# Patient Record
Sex: Male | Born: 1970 | Race: White | Hispanic: No | Smoking: Never smoker
Health system: Southern US, Community
[De-identification: ages and names within clinical notes are randomized; demographics above are authoritative.]

---

## 2000-11-14 ENCOUNTER — Ambulatory Visit (HOSPITAL_COMMUNITY): Admission: RE | Admit: 2000-11-14 | Discharge: 2000-11-14 | Payer: Self-pay | Admitting: Internal Medicine

## 2000-11-14 ENCOUNTER — Encounter: Payer: Self-pay | Admitting: Internal Medicine

## 2002-04-29 HISTORY — PX: VASECTOMY: SHX75

## 2013-04-13 ENCOUNTER — Encounter (HOSPITAL_COMMUNITY): Payer: Self-pay | Admitting: Emergency Medicine

## 2013-04-13 ENCOUNTER — Encounter (HOSPITAL_COMMUNITY): Payer: PRIVATE HEALTH INSURANCE | Admitting: Anesthesiology

## 2013-04-13 ENCOUNTER — Emergency Department (HOSPITAL_COMMUNITY): Payer: PRIVATE HEALTH INSURANCE | Admitting: Anesthesiology

## 2013-04-13 ENCOUNTER — Emergency Department (HOSPITAL_COMMUNITY): Payer: PRIVATE HEALTH INSURANCE

## 2013-04-13 ENCOUNTER — Encounter (HOSPITAL_COMMUNITY)
Admission: EM | Disposition: A | Discharge status: Home / Self Care | Payer: Self-pay | Source: Home / Self Care | Attending: Emergency Medicine

## 2013-04-13 ENCOUNTER — Observation Stay (HOSPITAL_COMMUNITY)
Admission: EM | Admit: 2013-04-13 | Discharge: 2013-04-14 | Disposition: A | Discharge status: Home / Self Care | Payer: PRIVATE HEALTH INSURANCE | Attending: General Surgery | Admitting: General Surgery

## 2013-04-13 DIAGNOSIS — K3580 Unspecified acute appendicitis: Secondary | ICD-10-CM | POA: Diagnosis present

## 2013-04-13 DIAGNOSIS — I789 Disease of capillaries, unspecified: Secondary | ICD-10-CM | POA: Insufficient documentation

## 2013-04-13 DIAGNOSIS — K3533 Acute appendicitis with perforation and localized peritonitis, with abscess: Principal | ICD-10-CM | POA: Insufficient documentation

## 2013-04-13 DIAGNOSIS — Z01812 Encounter for preprocedural laboratory examination: Secondary | ICD-10-CM | POA: Insufficient documentation

## 2013-04-13 HISTORY — PX: LAPAROSCOPIC APPENDECTOMY: SHX408

## 2013-04-13 LAB — CBC WITH DIFFERENTIAL/PLATELET
Basophils Absolute: 0 10*3/uL (ref 0.0–0.1)
Basophils Relative: 0 % (ref 0–1)
Eosinophils Absolute: 0 10*3/uL (ref 0.0–0.7)
Eosinophils Relative: 0 % (ref 0–5)
HCT: 41.3 % (ref 39.0–52.0)
Hemoglobin: 14.3 g/dL (ref 13.0–17.0)
Lymphocytes Relative: 10 % — ABNORMAL LOW (ref 12–46)
Lymphs Abs: 1.5 10*3/uL (ref 0.7–4.0)
MCH: 31.1 pg (ref 26.0–34.0)
MCHC: 34.6 g/dL (ref 30.0–36.0)
MCV: 89.8 fL (ref 78.0–100.0)
Monocytes Absolute: 1 10*3/uL (ref 0.1–1.0)
Monocytes Relative: 7 % (ref 3–12)
Neutro Abs: 12.7 10*3/uL — ABNORMAL HIGH (ref 1.7–7.7)
Neutrophils Relative %: 84 % — ABNORMAL HIGH (ref 43–77)
Platelets: 190 10*3/uL (ref 150–400)
RBC: 4.6 MIL/uL (ref 4.22–5.81)
RDW: 13.8 % (ref 11.5–15.5)
WBC: 15.2 10*3/uL — ABNORMAL HIGH (ref 4.0–10.5)

## 2013-04-13 LAB — COMPREHENSIVE METABOLIC PANEL
ALT: 24 U/L (ref 0–53)
AST: 23 U/L (ref 0–37)
Albumin: 4.1 g/dL (ref 3.5–5.2)
Alkaline Phosphatase: 97 U/L (ref 39–117)
BUN: 11 mg/dL (ref 6–23)
CO2: 25 mEq/L (ref 19–32)
Calcium: 9.1 mg/dL (ref 8.4–10.5)
Chloride: 103 mEq/L (ref 96–112)
Creatinine, Ser: 0.85 mg/dL (ref 0.50–1.35)
GFR calc Af Amer: >90 mL/min (ref >90)
GFR calc non Af Amer: >90 mL/min (ref >90)
Glucose, Bld: 133 mg/dL — ABNORMAL HIGH (ref 70–99)
Potassium: 3.5 mEq/L (ref 3.5–5.1)
Sodium: 140 mEq/L (ref 135–145)
Total Bilirubin: 1.1 mg/dL (ref 0.3–1.2)
Total Protein: 7.8 g/dL (ref 6.0–8.3)

## 2013-04-13 LAB — LIPASE, BLOOD: Lipase: 19 U/L (ref 11–59)

## 2013-04-13 SURGERY — APPENDECTOMY, LAPAROSCOPIC
Anesthesia: General | Site: Abdomen

## 2013-04-13 MED ORDER — METOPROLOL TARTRATE 1 MG/ML IV SOLN
INTRAVENOUS | Status: AC
  Filled 2013-04-13: qty 5

## 2013-04-13 MED ORDER — ONDANSETRON HCL 4 MG/2ML IJ SOLN
INTRAMUSCULAR | Status: AC
  Filled 2013-04-13: qty 2

## 2013-04-13 MED ORDER — OXYCODONE-ACETAMINOPHEN 5-325 MG PO TABS
1.0000 | ORAL_TABLET | ORAL | Status: DC | PRN
  Administered 2013-04-13: 2 via ORAL
  Administered 2013-04-14: 1 via ORAL
  Administered 2013-04-14: 2 via ORAL
  Filled 2013-04-13 (×2): qty 2
  Filled 2013-04-13: qty 1

## 2013-04-13 MED ORDER — HYDROMORPHONE HCL PF 1 MG/ML IJ SOLN
1.0000 mg | INTRAMUSCULAR | Status: DC | PRN
  Administered 2013-04-13 – 2013-04-14 (×2): 1 mg via INTRAVENOUS
  Filled 2013-04-13 (×2): qty 1

## 2013-04-13 MED ORDER — BUPIVACAINE HCL (PF) 0.5 % IJ SOLN
INTRAMUSCULAR | Status: DC | PRN
  Administered 2013-04-13: 10 mL

## 2013-04-13 MED ORDER — ROCURONIUM BROMIDE 50 MG/5ML IV SOLN
INTRAVENOUS | Status: AC
  Filled 2013-04-13: qty 1

## 2013-04-13 MED ORDER — NEOSTIGMINE METHYLSULFATE 1 MG/ML IJ SOLN
INTRAMUSCULAR | Status: DC | PRN
  Administered 2013-04-13: 4 mg via INTRAVENOUS

## 2013-04-13 MED ORDER — ACETAMINOPHEN 325 MG PO TABS
ORAL_TABLET | ORAL | Status: AC
  Filled 2013-04-13: qty 2

## 2013-04-13 MED ORDER — FENTANYL CITRATE 0.05 MG/ML IJ SOLN
INTRAMUSCULAR | Status: DC | PRN
  Administered 2013-04-13 (×5): 50 ug via INTRAVENOUS
  Administered 2013-04-13: 100 ug via INTRAVENOUS

## 2013-04-13 MED ORDER — FENTANYL CITRATE 0.05 MG/ML IJ SOLN
INTRAMUSCULAR | Status: AC
  Filled 2013-04-13: qty 2

## 2013-04-13 MED ORDER — GLYCOPYRROLATE 0.2 MG/ML IJ SOLN
INTRAMUSCULAR | Status: AC
  Filled 2013-04-13: qty 1

## 2013-04-13 MED ORDER — LABETALOL HCL 5 MG/ML IV SOLN
INTRAVENOUS | Status: AC
  Filled 2013-04-13: qty 4

## 2013-04-13 MED ORDER — IOHEXOL 300 MG/ML  SOLN
50.0000 mL | Freq: Once | INTRAMUSCULAR | Status: AC | PRN
  Administered 2013-04-13: 50 mL via ORAL

## 2013-04-13 MED ORDER — MIDAZOLAM HCL 5 MG/5ML IJ SOLN
INTRAMUSCULAR | Status: DC | PRN
  Administered 2013-04-13: 2 mg via INTRAVENOUS

## 2013-04-13 MED ORDER — ONDANSETRON HCL 4 MG PO TABS: 4.0000 mg | ORAL_TABLET | Freq: Four times a day (QID) | ORAL | Status: DC | PRN

## 2013-04-13 MED ORDER — ONDANSETRON HCL 4 MG/2ML IJ SOLN
INTRAMUSCULAR | Status: DC | PRN
  Administered 2013-04-13: 4 mg via INTRAVENOUS

## 2013-04-13 MED ORDER — ROCURONIUM BROMIDE 100 MG/10ML IV SOLN
INTRAVENOUS | Status: DC | PRN
  Administered 2013-04-13 (×2): 10 mg via INTRAVENOUS

## 2013-04-13 MED ORDER — 0.9 % SODIUM CHLORIDE (POUR BTL) OPTIME
TOPICAL | Status: DC | PRN
  Administered 2013-04-13: 1000 mL

## 2013-04-13 MED ORDER — ARTIFICIAL TEARS OP OINT
TOPICAL_OINTMENT | OPHTHALMIC | Status: AC
Start: 2013-04-13 — End: 2013-04-13
  Filled 2013-04-13: qty 7

## 2013-04-13 MED ORDER — DEXTROSE 5 % IV SOLN
2.0000 g | INTRAVENOUS | Status: AC
  Administered 2013-04-13: 2 g via INTRAVENOUS
  Filled 2013-04-13 (×2): qty 2

## 2013-04-13 MED ORDER — FENTANYL CITRATE 0.05 MG/ML IJ SOLN
INTRAMUSCULAR | Status: AC
  Filled 2013-04-13: qty 5

## 2013-04-13 MED ORDER — ENOXAPARIN SODIUM 40 MG/0.4ML ~~LOC~~ SOLN
40.0000 mg | SUBCUTANEOUS | Status: DC
  Administered 2013-04-13: 40 mg via SUBCUTANEOUS
  Filled 2013-04-13: qty 0.4

## 2013-04-13 MED ORDER — METOPROLOL TARTRATE 1 MG/ML IV SOLN
INTRAVENOUS | Status: DC | PRN
  Administered 2013-04-13: 2 mg via INTRAVENOUS

## 2013-04-13 MED ORDER — IOHEXOL 300 MG/ML  SOLN
100.0000 mL | Freq: Once | INTRAMUSCULAR | Status: AC | PRN
  Administered 2013-04-13: 100 mL via INTRAVENOUS

## 2013-04-13 MED ORDER — SODIUM CHLORIDE 0.9 % IV BOLUS (SEPSIS)
1000.0000 mL | Freq: Once | INTRAVENOUS | Status: AC
  Administered 2013-04-13: 1000 mL via INTRAVENOUS

## 2013-04-13 MED ORDER — GLYCOPYRROLATE 0.2 MG/ML IJ SOLN
INTRAMUSCULAR | Status: AC
  Filled 2013-04-13: qty 3

## 2013-04-13 MED ORDER — GLYCOPYRROLATE 0.2 MG/ML IJ SOLN
INTRAMUSCULAR | Status: DC | PRN
  Administered 2013-04-13: 0.2 mg via INTRAVENOUS
  Administered 2013-04-13: 0.6 mg via INTRAVENOUS

## 2013-04-13 MED ORDER — BUPIVACAINE HCL (PF) 0.5 % IJ SOLN
INTRAMUSCULAR | Status: AC
  Filled 2013-04-13: qty 30

## 2013-04-13 MED ORDER — KETOROLAC TROMETHAMINE 30 MG/ML IJ SOLN
30.0000 mg | Freq: Once | INTRAMUSCULAR | Status: AC
  Administered 2013-04-13: 30 mg via INTRAVENOUS

## 2013-04-13 MED ORDER — PROPOFOL 10 MG/ML IV EMUL
INTRAVENOUS | Status: AC
  Filled 2013-04-13: qty 20

## 2013-04-13 MED ORDER — LACTATED RINGERS IV SOLN
INTRAVENOUS | Status: DC | PRN
  Administered 2013-04-13: 1000 mL
  Administered 2013-04-13 (×2): via INTRAVENOUS

## 2013-04-13 MED ORDER — LACTATED RINGERS IV SOLN
INTRAVENOUS | Status: DC
  Administered 2013-04-14: 05:00:00 via INTRAVENOUS

## 2013-04-13 MED ORDER — PROPOFOL 10 MG/ML IV BOLUS
INTRAVENOUS | Status: DC | PRN
  Administered 2013-04-13: 150 mg via INTRAVENOUS

## 2013-04-13 MED ORDER — KETOROLAC TROMETHAMINE 30 MG/ML IJ SOLN
INTRAMUSCULAR | Status: AC
  Filled 2013-04-13: qty 1

## 2013-04-13 MED ORDER — HYDROMORPHONE HCL PF 1 MG/ML IJ SOLN
1.0000 mg | Freq: Once | INTRAMUSCULAR | Status: AC
  Administered 2013-04-13: 1 mg via INTRAVENOUS
  Filled 2013-04-13: qty 1

## 2013-04-13 MED ORDER — LIDOCAINE HCL (PF) 1 % IJ SOLN
INTRAMUSCULAR | Status: AC
  Filled 2013-04-13: qty 5

## 2013-04-13 MED ORDER — ACETAMINOPHEN 325 MG PO TABS: 650.0000 mg | ORAL_TABLET | Freq: Four times a day (QID) | ORAL | Status: DC | PRN

## 2013-04-13 MED ORDER — MIDAZOLAM HCL 2 MG/2ML IJ SOLN
INTRAMUSCULAR | Status: AC
  Filled 2013-04-13: qty 2

## 2013-04-13 MED ORDER — CEFOXITIN SODIUM 2 G IV SOLR
2.0000 g | Freq: Four times a day (QID) | INTRAVENOUS | Status: DC
  Administered 2013-04-13 – 2013-04-14 (×2): 2 g via INTRAVENOUS
  Filled 2013-04-13 (×7): qty 2

## 2013-04-13 MED ORDER — ONDANSETRON HCL 4 MG/2ML IJ SOLN
4.0000 mg | Freq: Once | INTRAMUSCULAR | Status: AC
  Administered 2013-04-13: 4 mg via INTRAVENOUS
  Filled 2013-04-13: qty 2

## 2013-04-13 MED ORDER — ACETAMINOPHEN 325 MG PO TABS
650.0000 mg | ORAL_TABLET | Freq: Once | ORAL | Status: AC
  Administered 2013-04-13: 650 mg via ORAL

## 2013-04-13 MED ORDER — ONDANSETRON HCL 4 MG/2ML IJ SOLN: 4.0000 mg | Freq: Four times a day (QID) | INTRAMUSCULAR | Status: DC | PRN

## 2013-04-13 MED ORDER — DEXTROSE 5 % IV SOLN: 2.0000 g | INTRAVENOUS | Status: DC

## 2013-04-13 MED ORDER — DEXTROSE 5 % IV SOLN
INTRAVENOUS | Status: AC
  Filled 2013-04-13 (×2): qty 2

## 2013-04-13 SURGICAL SUPPLY — 46 items
BAG HAMPER (MISCELLANEOUS) ×2 IMPLANT
CLOTH BEACON ORANGE TIMEOUT ST (SAFETY) ×2 IMPLANT
COVER LIGHT HANDLE STERIS (MISCELLANEOUS) ×4 IMPLANT
CUTTER FLEX LINEAR 45M (STAPLE) ×2 IMPLANT
CUTTER LINEAR ENDO 35 ETS (STAPLE) IMPLANT
CUTTER LINEAR ENDO 35 ETS TH (STAPLE) IMPLANT
DECANTER SPIKE VIAL GLASS SM (MISCELLANEOUS) ×2 IMPLANT
DISSECTOR BLUNT TIP ENDO 5MM (MISCELLANEOUS) IMPLANT
DURAPREP 26ML APPLICATOR (WOUND CARE) ×2 IMPLANT
ELECT REM PT RETURN 9FT ADLT (ELECTROSURGICAL) ×2
ELECTRODE REM PT RTRN 9FT ADLT (ELECTROSURGICAL) ×1 IMPLANT
FILTER SMOKE EVAC LAPAROSHD (FILTER) ×2 IMPLANT
FORMALIN 10 PREFIL 120ML (MISCELLANEOUS) ×2 IMPLANT
GLOVE BIO SURGEON STRL SZ7.5 (GLOVE) ×2 IMPLANT
GLOVE BIOGEL PI IND STRL 6.5 (GLOVE) ×1 IMPLANT
GLOVE BIOGEL PI IND STRL 7.0 (GLOVE) ×1 IMPLANT
GLOVE BIOGEL PI INDICATOR 6.5 (GLOVE) ×1
GLOVE BIOGEL PI INDICATOR 7.0 (GLOVE) ×1
GLOVE OPTIFIT SS 6.5 STRL BRWN (GLOVE) ×2 IMPLANT
GOWN STRL REIN XL XLG (GOWN DISPOSABLE) ×4 IMPLANT
INST SET LAPROSCOPIC AP (KITS) ×2 IMPLANT
IV NS IRRIG 3000ML ARTHROMATIC (IV SOLUTION) IMPLANT
KIT ROOM TURNOVER APOR (KITS) ×2 IMPLANT
MANIFOLD NEPTUNE II (INSTRUMENTS) ×2 IMPLANT
NEEDLE INSUFFLATION 14GA 120MM (NEEDLE) ×2 IMPLANT
NS IRRIG 1000ML POUR BTL (IV SOLUTION) ×2 IMPLANT
PACK LAP CHOLE LZT030E (CUSTOM PROCEDURE TRAY) ×2 IMPLANT
PAD ARMBOARD 7.5X6 YLW CONV (MISCELLANEOUS) ×2 IMPLANT
POUCH SPECIMEN RETRIEVAL 10MM (ENDOMECHANICALS) ×2 IMPLANT
RELOAD /EVU35 (ENDOMECHANICALS) IMPLANT
RELOAD 45 VASCULAR/THIN (ENDOMECHANICALS) ×2 IMPLANT
RELOAD CUTTER ETS 35MM STAND (ENDOMECHANICALS) IMPLANT
SCALPEL HARMONIC ACE (MISCELLANEOUS) ×2 IMPLANT
SET BASIN LINEN APH (SET/KITS/TRAYS/PACK) ×2 IMPLANT
SET TUBE IRRIG SUCTION NO TIP (IRRIGATION / IRRIGATOR) IMPLANT
SPONGE GAUZE 2X2 8PLY STRL LF (GAUZE/BANDAGES/DRESSINGS) ×2 IMPLANT
STAPLER VISISTAT (STAPLE) ×2 IMPLANT
SUT VICRYL 0 UR6 27IN ABS (SUTURE) ×2 IMPLANT
TAPE CLOTH SURG 4X10 WHT LF (GAUZE/BANDAGES/DRESSINGS) ×2 IMPLANT
TRAY FOLEY CATH 16FR SILVER (SET/KITS/TRAYS/PACK) ×2 IMPLANT
TROCAR ENDO BLADELESS 11MM (ENDOMECHANICALS) ×2 IMPLANT
TROCAR ENDO BLADELESS 12MM (ENDOMECHANICALS) ×2 IMPLANT
TROCAR XCEL NON-BLD 5MMX100MML (ENDOMECHANICALS) ×2 IMPLANT
TUBING INSUFFLATION (TUBING) ×2 IMPLANT
WARMER LAPAROSCOPE (MISCELLANEOUS) ×2 IMPLANT
YANKAUER SUCT 12FT TUBE ARGYLE (SUCTIONS) ×2 IMPLANT

## 2013-04-13 NOTE — ED Notes (Signed)
Pt c/o upper abd pain with n/dizziness this am.

## 2013-04-13 NOTE — ED Provider Notes (Deleted)
CSN: [id number]     Arrival date & time 04/13/13  1112 History   First MD Initiated Contact with Patient 04/13/13 1201     Chief Complaint  Patient presents with  . Abdominal Pain   (Consider location/radiation/quality/duration/timing/severity/associated sxs/prior Treatment) HPI HPI Comments: Stanley Greer is a 42 y.o. male who presents to the Emergency Department complaining of sharp supra umbilical adbominal pain which radiates to right lower quadrant  five hours PTA. Pt drinks ETOH occasionaly ( three times a week). Pt hdad dirreha this morning and nausea. Pt denies vomiting. No chronic health problems. No prior abdominal surgery. Severity is moderate  supa umbillicus tenderness. Iv and pain.  History reviewed. No pertinent past medical history. History reviewed. No pertinent past surgical history. No family history on file. History  Substance Use Topics  . Smoking status: Never Smoker   . Smokeless tobacco: Not on file  . Alcohol Use: No    Review of Systems  Allergies  Review of patient's allergies indicates no known allergies.  Home Medications  No current outpatient prescriptions on file. BP 119/66  Pulse 65  Temp(Src) 97.9 F (36.6 C)  Resp 18  Ht 5\' 11"  (1.803 m)  Wt 175 lb (79.379 kg)  BMI 24.42 kg/m2  SpO2 100% Physical Exam  ED Course  Procedures (including critical care time) Labs Review Labs Reviewed  CBC WITH DIFFERENTIAL - Abnormal; Notable for the following:    WBC 15.2 (*)    Neutrophils Relative % 84 (*)    Neutro Abs 12.7 (*)    Lymphocytes Relative 10 (*)    All other components within normal limits  COMPREHENSIVE METABOLIC PANEL - Abnormal; Notable for the following:    Glucose, Bld 133 (*)    All other components within normal limits  LIPASE, BLOOD   Imaging Review Ct Abdomen Pelvis W Contrast  04/13/2013   CLINICAL DATA:  Abdominal pain, diarrhea, nausea  EXAM: CT ABDOMEN AND PELVIS WITH CONTRAST  TECHNIQUE: Multidetector CT imaging  of the abdomen and pelvis was performed using the standard protocol following bolus administration of intravenous contrast.  CONTRAST:  50mL OMNIPAQUE IOHEXOL 300 MG/ML SOLN, OMNIPAQUE IOHEXOL 300 MG/ML SOLN  COMPARISON:  None.  FINDINGS: Lung bases are unremarkable. Liver, spleen, pancreas and adrenals are unremarkable. Mild distended gallbladder without calcified gallstones. Abdominal aorta is unremarkable. No small bowel obstruction. No aortic aneurysm. No ascites or free air.  Axial image 62 there is abnormal enlargement and enhancement of the wall of the appendix the appendix is located anterior to the cecum in right anterior or pelvis. Best seen in axial image 62 measures 1.4 cm in diameter. Mild stranding of surrounding fat. Findings are consistent with acute appendicitis. This is confirmed on coronal image 30.  Prostate gland and seminal vesicles are unremarkable. Urinary bladder is empty limiting its assessment. Limited assessment of distal colon which is empty.  Kidneys are symmetrical in enhancement. No hydronephrosis or hydroureter.  Delayed renal images shows bilateral renal symmetrical excretion. Bilateral visualized proximal ureter is unremarkable.  IMPRESSION: 1. Abnormal enlargement and enhancement of the appendix in right lower quadrant of the abdomen consistent with acute appendicitis. 2. No small bowel obstruction. 3. No hydronephrosis or hydroureter. These results were called by telephone at the time of interpretation on 04/13/2013 at 3:21 PM to Dr. Donnetta Hutching , who verbally acknowledged these results.   Electronically Signed   By: Natasha Mead M.D.   On: 04/13/2013 15:25    EKG Interpretation  None       MDM  No diagnosis found.  History and physical consistent with appendicitis. CT scan confirms same.  Discussed with general surgeon Dr. Franky Macho  I personally performed the services described in this documentation, which was scribed in my presence. The recorded  information has been reviewed and is accurate.     Donnetta Hutching, MD 04/13/13 [phone]

## 2013-04-13 NOTE — Anesthesia Postprocedure Evaluation (Signed)
  Anesthesia Post-op Note  Patient: Stanley Greer  Procedure(s) Performed: Procedure(s): APPENDECTOMY LAPAROSCOPIC (N/A)  Patient Location: PACU  Anesthesia Type:General  Level of Consciousness: awake, oriented and patient cooperative  Airway and Oxygen Therapy: Patient Spontanous Breathing and Patient connected to face mask oxygen  Post-op Pain: mild  Post-op Assessment: Post-op Vital signs reviewed, Patient's Cardiovascular Status Stable, Respiratory Function Stable, Patent Airway, No signs of Nausea or vomiting and Pain level controlled  Post-op Vital Signs: Reviewed and stable  Complications: No apparent anesthesia complications

## 2013-04-13 NOTE — Progress Notes (Signed)
ED/CM noted patient did not have health insurance and/or PCP listed in the computer. Per patient pending insurance with [employer] and signed up during enrollment period.  Will continue to have Dr. Elfredia Nevins as PCP. Offered other information for patient and let patient know I was available if they had any questions or concerns.

## 2013-04-13 NOTE — Anesthesia Procedure Notes (Signed)
Procedure Name: Intubation Date/Time: 04/13/2013 5:16 PM Performed by: Pernell Dupre, Charne Mcbrien A Pre-anesthesia Checklist: Patient identified, Patient being monitored, Timeout performed, Emergency Drugs available and Suction available Patient Re-evaluated:Patient Re-evaluated prior to inductionOxygen Delivery Method: Circle System Utilized Preoxygenation: Pre-oxygenation with 100% oxygen Intubation Type: IV induction Ventilation: Mask ventilation without difficulty Laryngoscope Size: 3 and Miller Grade View: Grade I Tube type: Oral Tube size: 7.0 mm Number of attempts: 1 Airway Equipment and Method: stylet Placement Confirmation: ETT inserted through vocal cords under direct vision,  positive ETCO2 and breath sounds checked- equal and bilateral Secured at: 21 cm Tube secured with: Tape Dental Injury: Teeth and Oropharynx as per pre-operative assessment

## 2013-04-13 NOTE — Op Note (Signed)
Patient:  Stanley Greer  DOB:  12-27-1970  MRN:  [id number]   Preop Diagnosis:  Acute appendicitis  Postop Diagnosis:  Same, small and large bowel telangiectasias  Procedure:  Laparoscopic appendectomy  Surgeon:  Franky Macho, M.D.  Anes:  General endotracheal  Indications:  Patient is a 42 year old white male who presents with lower abdominal pain secondary to acute appendicitis. The risks and benefits of the procedure including bleeding, infection, and the possibility of an open procedure were fully explained to the patient, who gave informed consent.  Procedure note:  The patient is placed the supine position. After induction of general endotracheal anesthesia, the abdomen was prepped and draped using usual sterile technique with DuraPrep. Surgical site confirmation was performed.  A supraumbilical incision was made down to the fascia. A Veress needle was introduced into the abdominal cavity and confirmation of placement was done using the saline drop test. The abdomen was then insufflated to 16 mm mercury pressure. An 11 mm trocar was introduced into the abdominal cavity under direct visualization without difficulty. The patient was placed in deeper Trendelenburg position and an additional 12 mm trocar was placed the suprapubic region and 5 mm trocar was placed in left lower quadrant region.  On inspection of the small bowel, nor multiple loops that had telangiectasias present. There also normal distal small bowel loops. Just distal to the cecum, one area of telangiectasia was noted on the colon. The appendix was visualized and noted to be diffusely inflamed. The mesoappendix was divided using the harmonic scalpel. A standard Endo GIA was placed across the base the appendix and fired. The appendix was then delivered through the suprapubic trocar site using an Endo Catch bag and sent to pathology further examination. The staple line was inspected and any bleeding was controlled using Bovie  electrocautery. All fluid and air were then evacuated from the right lower corner prior to removal of the trochars.  All wounds were irrigated with normal saline. All wounds were injected with 0.5% Sensorcaine. The supraumbilical fascia as well as suprapubic fascia were reapproximated using 0 Vicryl interrupted sutures. All skin incisions were closed using staples. Betadine ointment and dry sterile dressings were applied.  All tape and needle counts were correct at the end of the procedure. Patient was extubated in the operating room and transferred to PACU in stable condition.  Complications:  None  EBL:  Minimal  Specimen:  Appendix

## 2013-04-13 NOTE — H&P (Signed)
Stanley Greer is an 42 y.o. male.   Chief Complaint: Abdominal pain HPI: Patient is a 42 year old white male who woke up this morning with worsening upper abdominal pain. This has moved to the right lower quadrant. CT scan the abdomen reveals acute appendicitis without perforation. He denies any fever or chills.  History reviewed. No pertinent past medical history.  History reviewed. No pertinent past surgical history.  No family history on file. Social History:  reports that he has never smoked. He does not have any smokeless tobacco history on file. He reports that he does not drink alcohol or use illicit drugs.  Allergies: No Known Allergies   (Not in a hospital admission)  Results for orders placed during the hospital encounter of 04/13/13 (from the past 48 hour(s))  CBC WITH DIFFERENTIAL     Status: Abnormal   Collection Time    04/13/13 11:44 AM      Result Value Range   WBC 15.2 (*) 4.0 - 10.5 K/uL   RBC 4.60  4.22 - 5.81 MIL/uL   Hemoglobin 14.3  13.0 - 17.0 g/dL   HCT 78.2  95.6 - 21.3 %   MCV 89.8  78.0 - 100.0 fL   MCH 31.1  26.0 - 34.0 pg   MCHC 34.6  30.0 - 36.0 g/dL   RDW 08.6  57.8 - 46.9 %   Platelets 190  150 - 400 K/uL   Neutrophils Relative % 84 (*) 43 - 77 %   Neutro Abs 12.7 (*) 1.7 - 7.7 K/uL   Lymphocytes Relative 10 (*) 12 - 46 %   Lymphs Abs 1.5  0.7 - 4.0 K/uL   Monocytes Relative 7  3 - 12 %   Monocytes Absolute 1.0  0.1 - 1.0 K/uL   Eosinophils Relative 0  0 - 5 %   Eosinophils Absolute 0.0  0.0 - 0.7 K/uL   Basophils Relative 0  0 - 1 %   Basophils Absolute 0.0  0.0 - 0.1 K/uL  COMPREHENSIVE METABOLIC PANEL     Status: Abnormal   Collection Time    04/13/13 11:44 AM      Result Value Range   Sodium 140  135 - 145 mEq/L   Potassium 3.5  3.5 - 5.1 mEq/L   Chloride 103  96 - 112 mEq/L   CO2 25  19 - 32 mEq/L   Glucose, Bld 133 (*) 70 - 99 mg/dL   BUN 11  6 - 23 mg/dL   Creatinine, Ser 6.29  0.50 - 1.35 mg/dL   Calcium 9.1  8.4 - 52.8 mg/dL    Total Protein 7.8  6.0 - 8.3 g/dL   Albumin 4.1  3.5 - 5.2 g/dL   AST 23  0 - 37 U/L   ALT 24  0 - 53 U/L   Alkaline Phosphatase 97  39 - 117 U/L   Total Bilirubin 1.1  0.3 - 1.2 mg/dL   GFR calc non Af Amer >90  >90 mL/min   GFR calc Af Amer >90  >90 mL/min   Comment: (NOTE)     The eGFR has been calculated using the CKD EPI equation.     This calculation has not been validated in all clinical situations.     eGFR's persistently <90 mL/min signify possible Chronic Kidney     Disease.  LIPASE, BLOOD     Status: None   Collection Time    04/13/13 11:44 AM      Result  Value Range   Lipase 19  11 - 59 U/L   Ct Abdomen Pelvis W Contrast  04/13/2013   CLINICAL DATA:  Abdominal pain, diarrhea, nausea  EXAM: CT ABDOMEN AND PELVIS WITH CONTRAST  TECHNIQUE: Multidetector CT imaging of the abdomen and pelvis was performed using the standard protocol following bolus administration of intravenous contrast.  CONTRAST:  50mL OMNIPAQUE IOHEXOL 300 MG/ML SOLN, OMNIPAQUE IOHEXOL 300 MG/ML SOLN  COMPARISON:  None.  FINDINGS: Lung bases are unremarkable. Liver, spleen, pancreas and adrenals are unremarkable. Mild distended gallbladder without calcified gallstones. Abdominal aorta is unremarkable. No small bowel obstruction. No aortic aneurysm. No ascites or free air.  Axial image 62 there is abnormal enlargement and enhancement of the wall of the appendix the appendix is located anterior to the cecum in right anterior or pelvis. Best seen in axial image 62 measures 1.4 cm in diameter. Mild stranding of surrounding fat. Findings are consistent with acute appendicitis. This is confirmed on coronal image 30.  Prostate gland and seminal vesicles are unremarkable. Urinary bladder is empty limiting its assessment. Limited assessment of distal colon which is empty.  Kidneys are symmetrical in enhancement. No hydronephrosis or hydroureter.  Delayed renal images shows bilateral renal symmetrical excretion.  Bilateral visualized proximal ureter is unremarkable.  IMPRESSION: 1. Abnormal enlargement and enhancement of the appendix in right lower quadrant of the abdomen consistent with acute appendicitis. 2. No small bowel obstruction. 3. No hydronephrosis or hydroureter. These results were called by telephone at the time of interpretation on 04/13/2013 at 3:21 PM to Dr. Donnetta Hutching , who verbally acknowledged these results.   Electronically Signed   By: Natasha Mead M.D.   On: 04/13/2013 15:25    Review of Systems  Constitutional: Positive for malaise/fatigue.  Respiratory: Negative.   Cardiovascular: Negative.   Gastrointestinal: Positive for nausea and abdominal pain.  Genitourinary: Negative.   Musculoskeletal: Negative.   Skin: Negative.   All other systems reviewed and are negative.    Blood pressure 110/71, pulse 76, temperature 97.9 F (36.6 C), resp. rate 21, height 5\' 11"  (1.803 m), weight 79.379 kg (175 lb), SpO2 100.00%. Physical Exam  Vitals reviewed. Constitutional: He is oriented to person, place, and time. He appears well-developed and well-nourished.  HENT:  Head: Normocephalic and atraumatic.  Neck: Normal range of motion. Neck supple.  Cardiovascular: Normal rate, regular rhythm and normal heart sounds.   Respiratory: Effort normal and breath sounds normal.  GI: Soft. There is tenderness. There is no rebound.  Tender right lower quadrant to deep palpation. No rigidity noted. No hepatosplenomegaly, masses, or hernias are identified.  Musculoskeletal: Normal range of motion.  Neurological: He is alert and oriented to person, place, and time.  Skin: Skin is warm and dry.     Assessment/Plan Impression: Acute appendicitis Plan: Patient will be taken to the operating room for laparoscopic appendectomy. The risks and benefits of the procedure including bleeding, infection, and the possibility of an open procedure were fully explained to the patient, who gave informed  consent.  Stanley Greer A 04/13/2013, 4:26 PM

## 2013-04-13 NOTE — Anesthesia Preprocedure Evaluation (Signed)
Anesthesia Evaluation  Patient identified by MRN, date of birth, ID band Patient awake    Reviewed: Allergy & Precautions, H&P , NPO status , Patient's Chart, lab work & pertinent test results  Airway Mallampati: II TM Distance: >3 FB Neck ROM: full    Dental no notable dental hx. (+) Teeth Intact   Pulmonary  breath sounds clear to auscultation        Cardiovascular Rhythm:regular     Neuro/Psych    GI/Hepatic Patient received Oral Contrast Agents,Nausea   Endo/Other    Renal/GU      Musculoskeletal   Abdominal   Peds  Hematology   Anesthesia Other Findings   Reproductive/Obstetrics                           Anesthesia Physical Anesthesia Plan  ASA: I and emergent  Anesthesia Plan: General ETT, Rapid Sequence and Cricoid Pressure   Post-op Pain Management:    Induction:   Airway Management Planned:   Additional Equipment:   Intra-op Plan:   Post-operative Plan:   Informed Consent: I have reviewed the patients History and Physical, chart, labs and discussed the procedure including the risks, benefits and alternatives for the proposed anesthesia with the patient or authorized representative who has indicated his/her understanding and acceptance.   Dental Advisory Given  Plan Discussed with: Surgeon and Anesthesiologist  Anesthesia Plan Comments:         Anesthesia Quick Evaluation

## 2013-04-13 NOTE — Transfer of Care (Signed)
Immediate Anesthesia Transfer of Care Note  Patient: Stanley Greer  Procedure(s) Performed: Procedure(s): APPENDECTOMY LAPAROSCOPIC (N/A)  Patient Location: PACU  Anesthesia Type:General  Level of Consciousness: awake, oriented and patient cooperative  Airway & Oxygen Therapy: Patient Spontanous Breathing and Patient connected to face mask oxygen  Post-op Assessment: Report given to PACU RN and Post -op Vital signs reviewed and stable  Post vital signs: Reviewed and stable  Complications: No apparent anesthesia complications

## 2013-04-14 LAB — BASIC METABOLIC PANEL
CO2: 28 mEq/L (ref 19–32)
Calcium: 8.4 mg/dL (ref 8.4–10.5)
Creatinine, Ser: 0.94 mg/dL (ref 0.50–1.35)
GFR calc non Af Amer: >90 mL/min (ref >90)
Potassium: 3.7 mEq/L (ref 3.5–5.1)
Sodium: 139 mEq/L (ref 135–145)

## 2013-04-14 LAB — CBC
HCT: 34.6 % — ABNORMAL LOW (ref 39.0–52.0)
Hemoglobin: 11.7 g/dL — ABNORMAL LOW (ref 13.0–17.0)
MCH: 31 pg (ref 26.0–34.0)
MCV: 91.5 fL (ref 78.0–100.0)
RBC: 3.78 MIL/uL — ABNORMAL LOW (ref 4.22–5.81)

## 2013-04-14 MED ORDER — OXYCODONE-ACETAMINOPHEN 7.5-325 MG PO TABS: 1.0000 | ORAL_TABLET | ORAL | Status: DC | PRN

## 2013-04-14 NOTE — Anesthesia Postprocedure Evaluation (Signed)
  Anesthesia Post-op Note  Patient: Stanley Greer  Procedure(s) Performed: Procedure(s): APPENDECTOMY LAPAROSCOPIC (N/A)  Patient Location: room 303  Anesthesia Type:General  Level of Consciousness: awake, alert , oriented and patient cooperative  Airway and Oxygen Therapy: Patient Spontanous Breathing  Post-op Pain: 2 /10, mild  Post-op Assessment: Post-op Vital signs reviewed, Patient's Cardiovascular Status Stable, Respiratory Function Stable, Patent Airway, No signs of Nausea or vomiting, Adequate PO intake and Pain level controlled  Post-op Vital Signs: Reviewed and stable  Complications: No apparent anesthesia complications

## 2013-04-14 NOTE — Discharge Summary (Signed)
Physician Discharge Summary  Patient ID: Stanley Greer MRN: [id number] DOB/AGE: Feb 25, 1971 42 y.o.  Admit date: 04/13/2013 Discharge date: 04/14/2013  Admission Diagnoses: Acute appendicitis  Discharge Diagnoses: Same, telangiectasias of small and large bowel Active Problems:   Acute appendicitis   Discharged Condition: good  Hospital Course: Patient is a 42 year old white male who presented emergency room with a less than 24-hour history of worsening lower abdominal pain. CT scan the abdomen confirmed acute appendicitis. The patient was taken to the operating room and underwent laparoscopic appendectomy. At the time of surgery, he is also noted to have some small and large bowel involvement with telangiectasias. He tolerated the procedure well. His postoperative course has been unremarkable. His diet was advanced without difficulty. The patient is being discharged home in good improving condition.  Treatments: surgery: Laparoscopic appendectomy on 04/13/2013  Discharge Exam: Blood pressure 94/57, pulse 82, temperature 97.7 F (36.5 C), temperature source Oral, resp. rate 18, height 5\' 11"  (1.803 m), weight 86.5 kg (190 lb 11.2 oz), SpO2 93.00%. General appearance: alert, cooperative and no distress Resp: clear to auscultation bilaterally Cardio: regular rate and rhythm, S1, S2 normal, no murmur, click, rub or gallop GI: Soft. Dressings dry and intact.  Disposition: Home    Medication List         oxyCODONE-acetaminophen 7.5-325 MG per tablet  Commonly known as:  PERCOCET  Take 1-2 tablets by mouth every 4 (four) hours as needed.           Follow-up Information   Follow up with Dalia Heading, MD. Schedule an appointment as soon as possible for a visit on 04/19/2013.   Specialty:  General Surgery   Contact information:   [address] [place] [address] [phone]       Signed: Franky Macho A 04/14/2013, 8:32 AM

## 2013-04-14 NOTE — Progress Notes (Signed)
Pt discharged home today per Dr. Jenkins. Pt's IV site D/C'd and WNL. Pt's VS stable at this time. Pt provided with home medication list, discharge instructions and prescriptions. Verbalized understanding. Pt left floor via WC in stable condition accompanied by NT. 

## 2013-04-14 NOTE — Progress Notes (Signed)
[facility] chart review completed.  

## 2013-04-15 ENCOUNTER — Encounter (HOSPITAL_COMMUNITY): Payer: Self-pay | Admitting: General Surgery

## 2013-04-27 NOTE — ED Provider Notes (Signed)
CSN: [id number]     Arrival date & time 04/13/13  1112 History   First MD Initiated Contact with Patient 04/13/13 1201     Chief Complaint  Patient presents with  . Abdominal Pain   (Consider location/radiation/quality/duration/timing/severity/associated sxs/prior Treatment) HPI..... generalized abdominal pain radiating to right lower quadrant since this morning with associated anorexia. Patient is normally healthy. Pain is sharp in nature and moderate. No radiation of pain. No fever, chills, dysuria. No previous abdominal surgery  History reviewed. No pertinent past medical history. Past Surgical History  Procedure Laterality Date  . Vasectomy  2004  . Laparoscopic appendectomy N/A 04/13/2013    Procedure: APPENDECTOMY LAPAROSCOPIC;  Surgeon: Dalia Heading, MD;  Location: [facility];  Service: General;  Laterality: N/A;   History reviewed. No pertinent family history. History  Substance Use Topics  . Smoking status: Never Smoker   . Smokeless tobacco: Not on file  . Alcohol Use: Yes     Comment: occasionally    Review of Systems  All other systems reviewed and are negative.    Allergies  Review of patient's allergies indicates no known allergies.  Home Medications   Current Outpatient Rx  Name  Route  Sig  Dispense  Refill  . oxyCODONE-acetaminophen (PERCOCET) 7.5-325 MG per tablet   Oral   Take 1-2 tablets by mouth every 4 (four) hours as needed.   50 tablet   0    BP 94/57  Pulse 82  Temp(Src) 97.7 F (36.5 C) (Oral)  Resp 18  Ht 5\' 11"  (1.803 m)  Wt 190 lb 11.2 oz (86.5 kg)  BMI 26.61 kg/m2  SpO2 93% Physical Exam  Nursing note and vitals reviewed. Constitutional: He is oriented to person, place, and time. He appears well-developed and well-nourished.  HENT:  Head: Normocephalic and atraumatic.  Eyes: Conjunctivae and EOM are normal. Pupils are equal, round, and reactive to light.  Neck: Normal range of motion. Neck supple.  Cardiovascular: Normal rate,  regular rhythm and normal heart sounds.   Pulmonary/Chest: Effort normal and breath sounds normal.  Abdominal: Soft. Bowel sounds are normal.  Tender right lower abdomen  Musculoskeletal: Normal range of motion.  Neurological: He is alert and oriented to person, place, and time.  Skin: Skin is warm and dry.  Psychiatric: He has a normal mood and affect. His behavior is normal.    ED Course  Procedures (including critical care time) Labs Review Labs Reviewed  CBC WITH DIFFERENTIAL - Abnormal; Notable for the following:    WBC 15.2 (*)    Neutrophils Relative % 84 (*)    Neutro Abs 12.7 (*)    Lymphocytes Relative 10 (*)    All other components within normal limits  COMPREHENSIVE METABOLIC PANEL - Abnormal; Notable for the following:    Glucose, Bld 133 (*)    All other components within normal limits  CBC - Abnormal; Notable for the following:    RBC 3.78 (*)    Hemoglobin 11.7 (*)    HCT 34.6 (*)    All other components within normal limits  BASIC METABOLIC PANEL - Abnormal; Notable for the following:    Glucose, Bld 107 (*)    All other components within normal limits  LIPASE, BLOOD  SURGICAL PATHOLOGY   Imaging Review No results found.  EKG Interpretation    Date/Time:    Ventricular Rate:    PR Interval:    QRS Duration:   QT Interval:    QTC Calculation:  R Axis:     Text Interpretation:              MDM   1. Acute appendicitis    White count 15,000. CT scan confirms appendicitis. Discussed with Dr. Franky Macho.  Patient is hemodynamically stable.  Surgery scheduled for this evening    Donnetta Hutching, MD 04/29/13 1109

## 2013-06-07 NOTE — ED Provider Notes (Incomplete)
CSN: [id number]     Arrival date & time 04/13/13  1112 History   First MD Initiated Contact with Patient 04/13/13 1201     Chief Complaint  Patient presents with  . Abdominal Pain     (Consider location/radiation/quality/duration/timing/severity/associated sxs/prior Treatment) HPI  History reviewed. No pertinent past medical history. Past Surgical History  Procedure Laterality Date  . Vasectomy  2004  . Laparoscopic appendectomy N/A 04/13/2013    Procedure: APPENDECTOMY LAPAROSCOPIC;  Surgeon: Dalia HeadingMark A Jenkins, MD;  Location: [facility];  Service: General;  Laterality: N/A;   History reviewed. No pertinent family history. History  Substance Use Topics  . Smoking status: Never Smoker   . Smokeless tobacco: Not on file  . Alcohol Use: Yes     Comment: occasionally    Review of Systems    Allergies  Review of patient's allergies indicates no known allergies.  Home Medications   Current Outpatient Rx  Name  Route  Sig  Dispense  Refill  . oxyCODONE-acetaminophen (PERCOCET) 7.5-325 MG per tablet   Oral   Take 1-2 tablets by mouth every 4 (four) hours as needed.   50 tablet   0    BP 94/57  Pulse 82  Temp(Src) 97.7 F (36.5 C) (Oral)  Resp 18  Ht 5\' 11"  (1.803 m)  Wt 190 lb 11.2 oz (86.5 kg)  BMI 26.61 kg/m2  SpO2 93% Physical Exam  ED Course  Procedures (including critical care time) Labs Review Labs Reviewed  CBC WITH DIFFERENTIAL - Abnormal; Notable for the following:    WBC 15.2 (*)    Neutrophils Relative % 84 (*)    Neutro Abs 12.7 (*)    Lymphocytes Relative 10 (*)    All other components within normal limits  COMPREHENSIVE METABOLIC PANEL - Abnormal; Notable for the following:    Glucose, Bld 133 (*)    All other components within normal limits  CBC - Abnormal; Notable for the following:    RBC 3.78 (*)    Hemoglobin 11.7 (*)    HCT 34.6 (*)    All other components within normal limits  BASIC METABOLIC PANEL - Abnormal; Notable for the  following:    Glucose, Bld 107 (*)    All other components within normal limits  LIPASE, BLOOD  SURGICAL PATHOLOGY   Imaging Review No results found.    MDM   Final diagnoses:  Acute appendicitis    ***

## 2013-11-03 ENCOUNTER — Other Ambulatory Visit: Payer: Self-pay | Admitting: Dermatology

## 2013-12-11 ENCOUNTER — Encounter (HOSPITAL_COMMUNITY): Payer: Self-pay | Admitting: Emergency Medicine

## 2013-12-11 ENCOUNTER — Observation Stay (HOSPITAL_COMMUNITY)
Admission: EM | Admit: 2013-12-11 | Discharge: 2013-12-13 | Disposition: A | Discharge status: Home / Self Care | Payer: No Typology Code available for payment source | Attending: Internal Medicine | Admitting: Internal Medicine

## 2013-12-11 ENCOUNTER — Emergency Department (HOSPITAL_COMMUNITY): Payer: No Typology Code available for payment source

## 2013-12-11 DIAGNOSIS — S069X9A Unspecified intracranial injury with loss of consciousness of unspecified duration, initial encounter: Principal | ICD-10-CM

## 2013-12-11 DIAGNOSIS — F1210 Cannabis abuse, uncomplicated: Secondary | ICD-10-CM | POA: Diagnosis not present

## 2013-12-11 DIAGNOSIS — I6529 Occlusion and stenosis of unspecified carotid artery: Secondary | ICD-10-CM | POA: Diagnosis not present

## 2013-12-11 DIAGNOSIS — Z9852 Vasectomy status: Secondary | ICD-10-CM | POA: Diagnosis not present

## 2013-12-11 DIAGNOSIS — Y9229 Other specified public building as the place of occurrence of the external cause: Secondary | ICD-10-CM | POA: Insufficient documentation

## 2013-12-11 DIAGNOSIS — S02109A Fracture of base of skull, unspecified side, initial encounter for closed fracture: Secondary | ICD-10-CM | POA: Diagnosis not present

## 2013-12-11 DIAGNOSIS — I658 Occlusion and stenosis of other precerebral arteries: Secondary | ICD-10-CM | POA: Diagnosis not present

## 2013-12-11 DIAGNOSIS — H748X9 Other specified disorders of middle ear and mastoid, unspecified ear: Secondary | ICD-10-CM | POA: Diagnosis not present

## 2013-12-11 DIAGNOSIS — R55 Syncope and collapse: Secondary | ICD-10-CM | POA: Diagnosis present

## 2013-12-11 DIAGNOSIS — S1093XA Contusion of unspecified part of neck, initial encounter: Secondary | ICD-10-CM

## 2013-12-11 DIAGNOSIS — S0010XA Contusion of unspecified eyelid and periocular area, initial encounter: Secondary | ICD-10-CM | POA: Diagnosis not present

## 2013-12-11 DIAGNOSIS — F1910 Other psychoactive substance abuse, uncomplicated: Secondary | ICD-10-CM | POA: Diagnosis not present

## 2013-12-11 DIAGNOSIS — Z9089 Acquired absence of other organs: Secondary | ICD-10-CM | POA: Diagnosis not present

## 2013-12-11 DIAGNOSIS — W1809XA Striking against other object with subsequent fall, initial encounter: Secondary | ICD-10-CM | POA: Diagnosis not present

## 2013-12-11 DIAGNOSIS — S0003XA Contusion of scalp, initial encounter: Secondary | ICD-10-CM | POA: Insufficient documentation

## 2013-12-11 DIAGNOSIS — S0210XA Unspecified fracture of base of skull, initial encounter for closed fracture: Secondary | ICD-10-CM

## 2013-12-11 DIAGNOSIS — S0083XA Contusion of other part of head, initial encounter: Secondary | ICD-10-CM | POA: Insufficient documentation

## 2013-12-11 LAB — COMPREHENSIVE METABOLIC PANEL
ALK PHOS: 97 U/L (ref 39–117)
ALT: 23 U/L (ref 0–53)
ANION GAP: 12 (ref 5–15)
AST: 27 U/L (ref 0–37)
Albumin: 3.9 g/dL (ref 3.5–5.2)
BILIRUBIN TOTAL: 1.1 mg/dL (ref 0.3–1.2)
BUN: 11 mg/dL (ref 6–23)
CHLORIDE: 101 meq/L (ref 96–112)
CO2: 25 meq/L (ref 19–32)
Calcium: 9 mg/dL (ref 8.4–10.5)
Creatinine, Ser: 0.77 mg/dL (ref 0.50–1.35)
GLUCOSE: 145 mg/dL — AB (ref 70–99)
POTASSIUM: 4.4 meq/L (ref 3.7–5.3)
SODIUM: 138 meq/L (ref 137–147)
Total Protein: 7.7 g/dL (ref 6.0–8.3)

## 2013-12-11 LAB — CBC WITH DIFFERENTIAL/PLATELET
BASOS ABS: 0 10*3/uL (ref 0.0–0.1)
Basophils Relative: 0 % (ref 0–1)
EOS ABS: 0 10*3/uL (ref 0.0–0.7)
Eosinophils Relative: 0 % (ref 0–5)
HCT: 40.5 % (ref 39.0–52.0)
Hemoglobin: 14.1 g/dL (ref 13.0–17.0)
Lymphocytes Relative: 6 % — ABNORMAL LOW (ref 12–46)
Lymphs Abs: 0.7 10*3/uL (ref 0.7–4.0)
MCH: 31 pg (ref 26.0–34.0)
MCHC: 34.8 g/dL (ref 30.0–36.0)
MCV: 89 fL (ref 78.0–100.0)
MONOS PCT: 5 % (ref 3–12)
Monocytes Absolute: 0.6 10*3/uL (ref 0.1–1.0)
Neutro Abs: 10.1 10*3/uL — ABNORMAL HIGH (ref 1.7–7.7)
Neutrophils Relative %: 89 % — ABNORMAL HIGH (ref 43–77)
Platelets: 175 10*3/uL (ref 150–400)
RBC: 4.55 MIL/uL (ref 4.22–5.81)
RDW: 13.3 % (ref 11.5–15.5)
WBC: 11.4 10*3/uL — ABNORMAL HIGH (ref 4.0–10.5)

## 2013-12-11 LAB — PROTIME-INR
INR: 1.11 (ref 0.00–1.49)
Prothrombin Time: 14.3 seconds (ref 11.6–15.2)

## 2013-12-11 LAB — TROPONIN I: Troponin I: <0.3 ng/mL (ref <0.30)

## 2013-12-11 MED ORDER — SODIUM CHLORIDE 0.9 % IV SOLN
INTRAVENOUS | Status: DC
  Administered 2013-12-11 – 2013-12-12 (×2): via INTRAVENOUS

## 2013-12-11 MED ORDER — ONDANSETRON HCL 4 MG/2ML IJ SOLN
4.0000 mg | Freq: Once | INTRAMUSCULAR | Status: AC
  Administered 2013-12-11: 4 mg via INTRAVENOUS
  Filled 2013-12-11: qty 2

## 2013-12-11 MED ORDER — MORPHINE SULFATE 4 MG/ML IJ SOLN
4.0000 mg | Freq: Once | INTRAMUSCULAR | Status: AC
  Administered 2013-12-11: 4 mg via INTRAVENOUS
  Filled 2013-12-11: qty 1

## 2013-12-11 NOTE — ED Notes (Addendum)
Pt fell this morning around 11am hit head on pavement and loss conscientiousness for about per family. Woke up and attempted to get pt to go to hospital buy pt refused per family statement. Now pt eyes are black and he is vomiting with small amout blood noted. Family states pt has been laying down with periods of sleep and wake on and off. No deep sleep.  No blurry vision, headache from neck up, no pain to back or numbness or tingling in the fingers.  C-collar applied in triage. Notified Charge nurse of need to get patient back, patient placed in hallway bed 2 immediately due to no room available at this time.

## 2013-12-11 NOTE — ED Notes (Signed)
Pt daughter said he fell straight back and hit his head, pt has no memory at feeling dizzy after walking to car, bruising noted to both eyes bilaterally, c/o of headache, lethargic, able to follow commands.

## 2013-12-11 NOTE — ED Provider Notes (Signed)
CSN: [id number]     Arrival date & time 12/11/13  2144 History   First MD Initiated Contact with Patient 12/11/13 2226     Chief Complaint  Patient presents with  . Fall     (Consider location/radiation/quality/duration/timing/severity/associated sxs/prior Treatment) HPI  Stanley Greer is a 43 y.o. male brought in by police (at family member's request) for evaluation of loss of consciousness with head trauma, lethargy and vomiting. As per mother: the patient was with his children at the [place] in [place] when he had a loss of consciousness and fell backwards hitting the occipital part of his head on the pavement. As per his daughter who witnessed the event: He  was nonresponsive with his eyes open for 30s. There were no tonic-clonic movements, no incontinence. No history of seizure disorder as per mother. EMS was called and they advised [facility] but he refused. Patient had his 42 year old daughter drive him home. Patient has been lethargic, had several episodes  of nonbloody, nonbilious, no coffee-ground emesis he has bilateral periorbital ecchymoses. Patient also is reported to have been complaining of headache. As per patient, he is only in the ED because he "doesn't like the way his eyes look." Patient denies any chest pain, palpitations prior to the syncope. States that he felt lightheaded with no nausea. Endorses headache and cervicalgia now.    History reviewed. No pertinent past medical history. Past Surgical History  Procedure Laterality Date  . Vasectomy  2004  . Laparoscopic appendectomy N/A 04/13/2013    Procedure: APPENDECTOMY LAPAROSCOPIC;  Surgeon: Dalia HeadingMark A Jenkins, MD;  Location: [facility];  Service: General;  Laterality: N/A;   History reviewed. No pertinent family history. History  Substance Use Topics  . Smoking status: Never Smoker   . Smokeless tobacco: Not on file  . Alcohol Use: Yes     Comment: occasionally    Review of Systems  10 systems  reviewed and found to be negative, except as noted in the HPI.   Allergies  Review of patient's allergies indicates no known allergies.  Home Medications   Prior to Admission medications   Medication Sig Start Date End Date Taking? Authorizing Provider  oxyCODONE-acetaminophen (PERCOCET) 7.5-325 MG per tablet Take 1-2 tablets by mouth every 4 (four) hours as needed. 04/14/13   Dalia HeadingMark A Jenkins, MD   BP 117/75  Pulse 79  Temp(Src) 98.7 F (37.1 C) (Oral)  Resp 16  Ht 5\' 11"  (1.803 m)  Wt 180 lb (81.647 kg)  BMI 25.12 kg/m2  SpO2 97% Physical Exam  Nursing note and vitals reviewed. Constitutional: He appears well-developed and well-nourished. He appears lethargic. No distress.  HENT:  Patient has bruising bilaterally to the medial eyelids.   Left nostril with dry blood.  Right-sided hemotympanums.  Eyes: Conjunctivae and EOM are normal. Pupils are equal, round, and reactive to light.  Neck:  Soft C-spine collar in place  Cardiovascular: Normal rate, regular rhythm and intact distal pulses.   Pulmonary/Chest: Effort normal and breath sounds normal. No stridor. No respiratory distress. He has no wheezes. He has no rales. He exhibits no tenderness.  Abdominal: Soft. Bowel sounds are normal. He exhibits no distension and no mass. There is no tenderness. There is no rebound and no guarding.  Musculoskeletal: Normal range of motion.  Neurological: He appears lethargic. No cranial nerve deficit or sensory deficit. He exhibits abnormal muscle tone. He displays no seizure activity. GCS eye subscore is 3. GCS verbal subscore is  5. GCS motor subscore is 6.  Patient is somnolent, arousable to voice. Oriented to place and self and knows the month but not the day.  Strength to bilateral upper extremities is 3/5. Sensation is intact. Patient follows commands  Psychiatric: He has a normal mood and affect.    ED Course  Procedures (including critical care time) Labs Review Labs Reviewed   COMPREHENSIVE METABOLIC PANEL - Abnormal; Notable for the following:    Glucose, Bld 145 (*)    All other components within normal limits  CBC WITH DIFFERENTIAL - Abnormal; Notable for the following:    WBC 11.4 (*)    Neutrophils Relative % 89 (*)    Neutro Abs 10.1 (*)    Lymphocytes Relative 6 (*)    All other components within normal limits  PROTIME-INR  TROPONIN I  URINE RAPID DRUG SCREEN (HOSP PERFORMED)  URINALYSIS, ROUTINE W REFLEX MICROSCOPIC    Imaging Review Ct Angio Head W/cm &/or Wo Cm  12/12/2013   CLINICAL DATA:  Recent head trauma.  EXAM: CT ANGIOGRAPHY HEAD  TECHNIQUE: Multidetector CT imaging of the head was performed using the standard protocol during bolus administration of intravenous contrast. Multiplanar CT image reconstructions and MIPs were obtained to evaluate the vascular anatomy.  CONTRAST:  80mL OMNIPAQUE IOHEXOL 350 MG/ML SOLN  COMPARISON:  CT of the head December 11, 2013 at 2345 hr.  FINDINGS: Anterior circulation: Normal appearance of the cervical internal carotid arteries, petrous, cavernous and supra clinoid internal carotid arteries. Widely patent anterior communicating artery. Normal appearance of the anterior and middle cerebral arteries. No early filling of the left cavernous sinus.  Posterior circulation: Left vertebral artery is dominant with normal appearance of the vertebral arteries, vertebrobasilar junction and basilar artery, as well as main branch vessels. Normal appearance of the posterior cerebral arteries.  No large vessel occlusion, hemodynamically significant stenosis, dissection, luminal irregularity, contrast extravasation or aneurysm within the anterior nor posterior circulation.  No abnormal parenchymal enhancement on delayed imaging. However, relative paucity of left transverse sinus enhancement. Left skullbase fracture better seen on prior bone windows. Sphenoid sinus opacification air-fluid level, similar.  Review of the MIP images confirms  the above findings.  IMPRESSION: No acute vascular injury, no CT findings of carotid cavernous fistula.  Left skullbase fracture better seen on prior imaging. Sphenoid sinus opacification suggests underlying possible fracture.  Relative paucity of left transverse sinus enhancement, unclear if this reflects right-sided dominance though, given underlying fracture, dural venous sinus thrombosis not excluded.   Electronically Signed   By: Awilda Metro   On: 12/12/2013 00:57   Dg Cervical Spine Complete  12/11/2013   CLINICAL DATA:  Head and neck pain, status post fall.  Lethargy.  EXAM: CERVICAL SPINE  4+ VIEWS  COMPARISON:  None.  FINDINGS: There is no evidence of fracture or subluxation. Vertebral bodies demonstrate normal height and alignment. Intervertebral disc spaces are preserved. Prevertebral soft tissues are within normal limits. The provided odontoid view demonstrates no significant abnormality.  The visualized lung apices are clear.  IMPRESSION: No evidence of fracture or subluxation along the cervical spine.   Electronically Signed   By: Roanna Raider M.D.   On: 12/11/2013 23:42   Ct Head Wo Contrast  12/12/2013   CLINICAL DATA:  Status post fall. Hit head on pavement, with loss of consciousness. Bilateral periorbital bruising, vomiting, headache and bilateral neck pain.  EXAM: CT HEAD WITHOUT CONTRAST  CT MAXILLOFACIAL WITHOUT CONTRAST  TECHNIQUE: Multidetector CT imaging of the  head and maxillofacial structures were performed using the standard protocol without intravenous contrast. Multiplanar CT image reconstructions of the maxillofacial structures were also generated.  COMPARISON:  None.  FINDINGS: CT HEAD FINDINGS  There is no evidence of acute infarction, mass lesion, or intra- or extra-axial hemorrhage on CT.  The posterior fossa, including the cerebellum, brainstem and fourth ventricle, is within normal limits. The third and lateral ventricles, and basal ganglia are unremarkable in  appearance. The cerebral hemispheres are symmetric in appearance, with normal gray-white differentiation. No mass effect or midline shift is seen.  There is a minimally displaced fracture through left occiput, extending to the left side of the base of the skull. On maxillofacial images, extension through the canal for the internal carotid artery is suspected. Blood is seen filling the sphenoid sinus.  The orbits are within normal limits. The remaining paranasal sinuses and mastoid air cells are well-aerated. No significant soft tissue abnormalities are seen.  CT MAXILLOFACIAL FINDINGS  There is a minimally displaced fracture through the left occiput, visualized as it extends across the left side of the base of the skull. This is suspected to extend through the canal for the petrous portion of the left internal carotid artery. No additional fractures are seen.  The maxilla and mandible appear intact. The nasal bone is unremarkable in appearance. The visualized dentition demonstrates no acute abnormality.  The orbits are intact bilaterally. Blood is seen filling the sphenoid sinus. The remaining visualized paranasal sinuses and mastoid air cells are well-aerated.  Minimal air noted at the level of the foramen magnum, anterior to the spinal canal, may reflect trace air from a peripheral IV catheter. The parapharyngeal fat planes are preserved. The nasopharynx, oropharynx and hypopharynx are unremarkable in appearance. The visualized portions of the valleculae and piriform sinuses are grossly unremarkable.  The parotid and submandibular glands are within normal limits. No cervical lymphadenopathy is seen.  IMPRESSION: 1. No evidence of traumatic intracranial injury. 2. Minimally displaced fracture extending through the left occiput, down to the left side at the base of the skull. This is suspected to extend through the canal for the petrous portion of the left internal carotid artery. Blood seen filling the sphenoid  sinus. CTA of the head is recommended for further evaluation, to exclude injury to the left internal carotid artery. 3. No evidence of fracture or dislocation with regard to the maxillofacial structures.  These results were called by telephone at the time of interpretation on 12/11/2013 at 11:57 pm to [facility], who verbally acknowledged these results.   Electronically Signed   By: Roanna Raider M.D.   On: 12/12/2013 00:04   Ct Maxillofacial Wo Cm  12/12/2013   CLINICAL DATA:  Status post fall. Hit head on pavement, with loss of consciousness. Bilateral periorbital bruising, vomiting, headache and bilateral neck pain.  EXAM: CT HEAD WITHOUT CONTRAST  CT MAXILLOFACIAL WITHOUT CONTRAST  TECHNIQUE: Multidetector CT imaging of the head and maxillofacial structures were performed using the standard protocol without intravenous contrast. Multiplanar CT image reconstructions of the maxillofacial structures were also generated.  COMPARISON:  None.  FINDINGS: CT HEAD FINDINGS  There is no evidence of acute infarction, mass lesion, or intra- or extra-axial hemorrhage on CT.  The posterior fossa, including the cerebellum, brainstem and fourth ventricle, is within normal limits. The third and lateral ventricles, and basal ganglia are unremarkable in appearance. The cerebral hemispheres are symmetric in appearance, with normal gray-white differentiation. No mass effect or midline shift is seen.  There is a minimally displaced fracture through left occiput, extending to the left side of the base of the skull. On maxillofacial images, extension through the canal for the internal carotid artery is suspected. Blood is seen filling the sphenoid sinus.  The orbits are within normal limits. The remaining paranasal sinuses and mastoid air cells are well-aerated. No significant soft tissue abnormalities are seen.  CT MAXILLOFACIAL FINDINGS  There is a minimally displaced fracture through the left occiput, visualized as it  extends across the left side of the base of the skull. This is suspected to extend through the canal for the petrous portion of the left internal carotid artery. No additional fractures are seen.  The maxilla and mandible appear intact. The nasal bone is unremarkable in appearance. The visualized dentition demonstrates no acute abnormality.  The orbits are intact bilaterally. Blood is seen filling the sphenoid sinus. The remaining visualized paranasal sinuses and mastoid air cells are well-aerated.  Minimal air noted at the level of the foramen magnum, anterior to the spinal canal, may reflect trace air from a peripheral IV catheter. The parapharyngeal fat planes are preserved. The nasopharynx, oropharynx and hypopharynx are unremarkable in appearance. The visualized portions of the valleculae and piriform sinuses are grossly unremarkable.  The parotid and submandibular glands are within normal limits. No cervical lymphadenopathy is seen.  IMPRESSION: 1. No evidence of traumatic intracranial injury. 2. Minimally displaced fracture extending through the left occiput, down to the left side at the base of the skull. This is suspected to extend through the canal for the petrous portion of the left internal carotid artery. Blood seen filling the sphenoid sinus. CTA of the head is recommended for further evaluation, to exclude injury to the left internal carotid artery. 3. No evidence of fracture or dislocation with regard to the maxillofacial structures.  These results were called by telephone at the time of interpretation on 12/11/2013 at 11:57 pm to [facility], who verbally acknowledged these results.   Electronically Signed   By: Roanna Raider M.D.   On: 12/12/2013 00:04     EKG Interpretation   Date/Time:  Saturday December 11 2013 22:52:18 EDT Ventricular Rate:  84 PR Interval:  123 QRS Duration: 116 QT Interval:  391 QTC Calculation: 462 R Axis:   -9 Text Interpretation:  Sinus rhythm Nonspecific  intraventricular conduction  delay ST elev, probable normal early repol pattern No previous tracing  Confirmed by KNAPP  MD-J, JON ([id number]) on 12/11/2013 10:54:56 PM      MDM   Final diagnoses:  Basilar skull fracture, closed, initial encounter  Syncope, unspecified syncope type   Filed Vitals:   12/11/13 2153 12/11/13 2300 12/12/13 0045 12/12/13 0058  BP: 138/76 127/84  117/75  Pulse: 86 79 75 79  Temp: 98.8 F (37.1 C)   98.7 F (37.1 C)  TempSrc:    Oral  Resp: 20 17 16 16   Height: 5\' 11"  (1.803 m)     Weight: 180 lb (81.647 kg)     SpO2: 100% 99% 96% 97%    Medications  0.9 %  sodium chloride infusion ( Intravenous New Bag/Given 12/11/13 2356)  morphine 4 MG/ML injection 4 mg (4 mg Intravenous Given 12/11/13 2356)  ondansetron (ZOFRAN) injection 4 mg (4 mg Intravenous Given 12/11/13 2356)  iohexol (OMNIPAQUE) 350 MG/ML injection 80 mL (80 mLs Intravenous Contrast Given 12/12/13 0012)    Stanley Greer is a 43 y.o. male presenting with syncope and occipital head trauma with lethargy  and vomiting following. Patient is somnolent, has right-sided hemotympanums, left-sided blood in the nare. EKG, blood work, CT head, maxillofacial and C-spine imaging pending. EKG with no arrhythmia or ischemic changes. Vital signs within normal limits, saturating well on room air, blood pressure is not significantly elevated.  Verbal report from radiologist Dr. Cherly Hensen appreciated: Reports there is a left occipital fracture, blood filling the sphenoid sinus, he recommends CTA to evaluate for carotid artery involvement. No intracranial pathology.  This is a shared visit with the attending physician who is personally evaluated the patient.  I personally reviewed the imaging tests through PACS system  I reviewed available ER/hospitalization records through the EMR  No vascular injury seen on CTA. Dural venous sinus thrombosis is not excluded.  Neurosurgery consult from Dr. Venetia Maxon appreciated we have  discussed clinical scenario and imaging findings. Recommends observation and admission to hospitalist. They will consult.  Wynetta Emery, PA-C 12/12/13 [phone]

## 2013-12-12 ENCOUNTER — Emergency Department (HOSPITAL_COMMUNITY): Payer: No Typology Code available for payment source

## 2013-12-12 ENCOUNTER — Encounter (HOSPITAL_COMMUNITY): Payer: Self-pay | Admitting: Family Medicine

## 2013-12-12 ENCOUNTER — Observation Stay (HOSPITAL_COMMUNITY): Payer: No Typology Code available for payment source

## 2013-12-12 DIAGNOSIS — R55 Syncope and collapse: Secondary | ICD-10-CM | POA: Diagnosis present

## 2013-12-12 DIAGNOSIS — S02109A Fracture of base of skull, unspecified side, initial encounter for closed fracture: Secondary | ICD-10-CM | POA: Diagnosis present

## 2013-12-12 LAB — URINALYSIS, ROUTINE W REFLEX MICROSCOPIC
BILIRUBIN URINE: NEGATIVE
GLUCOSE, UA: NEGATIVE mg/dL
KETONES UR: 15 mg/dL — AB
Leukocytes, UA: NEGATIVE
Nitrite: NEGATIVE
PH: 7 (ref 5.0–8.0)
Protein, ur: NEGATIVE mg/dL
Specific Gravity, Urine: >1.046 — ABNORMAL HIGH (ref 1.005–1.030)
Urobilinogen, UA: 0.2 mg/dL (ref 0.0–1.0)

## 2013-12-12 LAB — CBC
HCT: 40.7 % (ref 39.0–52.0)
Hemoglobin: 13.6 g/dL (ref 13.0–17.0)
MCH: 30.6 pg (ref 26.0–34.0)
MCHC: 33.4 g/dL (ref 30.0–36.0)
MCV: 91.7 fL (ref 78.0–100.0)
PLATELETS: 169 10*3/uL (ref 150–400)
RBC: 4.44 MIL/uL (ref 4.22–5.81)
RDW: 13.7 % (ref 11.5–15.5)
WBC: 10.7 10*3/uL — ABNORMAL HIGH (ref 4.0–10.5)

## 2013-12-12 LAB — RAPID URINE DRUG SCREEN, HOSP PERFORMED
Amphetamines: POSITIVE — AB
BENZODIAZEPINES: NOT DETECTED
Barbiturates: NOT DETECTED
Cocaine: NOT DETECTED
Opiates: POSITIVE — AB
Tetrahydrocannabinol: POSITIVE — AB

## 2013-12-12 LAB — BASIC METABOLIC PANEL
ANION GAP: 13 (ref 5–15)
BUN: 9 mg/dL (ref 6–23)
CALCIUM: 8.9 mg/dL (ref 8.4–10.5)
CO2: 22 mEq/L (ref 19–32)
Chloride: 102 mEq/L (ref 96–112)
Creatinine, Ser: 0.68 mg/dL (ref 0.50–1.35)
GLUCOSE: 115 mg/dL — AB (ref 70–99)
POTASSIUM: 4.1 meq/L (ref 3.7–5.3)
SODIUM: 137 meq/L (ref 137–147)

## 2013-12-12 LAB — TSH: TSH: 0.414 u[IU]/mL (ref 0.350–4.500)

## 2013-12-12 LAB — URINE MICROSCOPIC-ADD ON

## 2013-12-12 MED ORDER — MORPHINE SULFATE 4 MG/ML IJ SOLN
4.0000 mg | Freq: Once | INTRAMUSCULAR | Status: AC
  Administered 2013-12-12: 4 mg via INTRAVENOUS
  Filled 2013-12-12: qty 1

## 2013-12-12 MED ORDER — ONDANSETRON HCL 4 MG/2ML IJ SOLN: 4.0000 mg | Freq: Four times a day (QID) | INTRAMUSCULAR | Status: DC | PRN

## 2013-12-12 MED ORDER — MORPHINE SULFATE 4 MG/ML IJ SOLN: 4.0000 mg | INTRAMUSCULAR | Status: DC | PRN

## 2013-12-12 MED ORDER — ACETAMINOPHEN 325 MG PO TABS
650.0000 mg | ORAL_TABLET | Freq: Four times a day (QID) | ORAL | Status: DC | PRN
  Administered 2013-12-12: 650 mg via ORAL
  Filled 2013-12-12: qty 2

## 2013-12-12 MED ORDER — DOCUSATE SODIUM 100 MG PO CAPS: 100.0000 mg | ORAL_CAPSULE | Freq: Every day | ORAL | Status: DC | PRN

## 2013-12-12 MED ORDER — ONDANSETRON HCL 4 MG PO TABS: 4.0000 mg | ORAL_TABLET | Freq: Four times a day (QID) | ORAL | Status: DC | PRN

## 2013-12-12 MED ORDER — IOHEXOL 350 MG/ML SOLN
80.0000 mL | Freq: Once | INTRAVENOUS | Status: AC | PRN
  Administered 2013-12-12: 80 mL via INTRAVENOUS

## 2013-12-12 MED ORDER — SODIUM CHLORIDE 0.9 % IJ SOLN
3.0000 mL | Freq: Two times a day (BID) | INTRAMUSCULAR | Status: DC
  Administered 2013-12-12 (×2): 3 mL via INTRAVENOUS

## 2013-12-12 MED ORDER — ALUM & MAG HYDROXIDE-SIMETH 200-200-20 MG/5ML PO SUSP: 30.0000 mL | Freq: Four times a day (QID) | ORAL | Status: DC | PRN

## 2013-12-12 MED ORDER — ACETAMINOPHEN 650 MG RE SUPP: 650.0000 mg | Freq: Four times a day (QID) | RECTAL | Status: DC | PRN

## 2013-12-12 NOTE — H&P (Signed)
History and Physical  [name] GNF:[id number] DOB: 1971/04/13 DOA: 12/11/2013  Referring physician: emergency Department PCP: Cassell Smiles., MD   Chief Complaint: syncopal episode with fall  HPI: Stanley Greer is a 43 y.o. male who is otherwise healthy, who presents after an unwitnessed syncopal episode and fall.  His sister was nearby and upon him down.  The patient had struck his head. EMS was called by his sister who evaluated him and brought him to the emergency department. The patient complained of head pain, that was improved with morphine. Additionally, patient had nausea. He admits to some photosensitivity.  He has no recollection of the event and is unable to determine his first recollection.  He does admit to previous episodes of syncope, which have never been evaluated. These events have no warning. He denies palpitations, irregular heartbeat, presyncope.     Review of Systems:   Pt complains of mild head pain and photosensitivity  Pt denies any nausea, vomiting, palpitations, irregular heartbeat, chest pain, problems breathing, shortness of breath.  Review of systems are otherwise negative  History reviewed. No pertinent past medical history. Past Surgical History  Procedure Laterality Date  . Vasectomy  2004  . Laparoscopic appendectomy N/A 04/13/2013    Procedure: APPENDECTOMY LAPAROSCOPIC;  Surgeon: Dalia Heading, MD;  Location: [facility];  Service: General;  Laterality: N/A;   Social History:  reports that he has never smoked. He does not have any smokeless tobacco history on file. He reports that he drinks alcohol. He reports that he does not use illicit drugs. Patient lives at home & is able to participate in activities of daily living without assistance  No Known Allergies  History reviewed. No pertinent family history.   Prior to Admission medications   Not on File    Physical Exam: BP 114/79  Pulse 95  Temp(Src) 98.7 F (37.1 C) (Oral)  Resp 16   Ht 5\' 11"  (1.803 m)  Wt 81.647 kg (180 lb)  BMI 25.12 kg/m2  SpO2 98%  General:  Middle-aged Caucasian male. No acute cardiopulmonary distress Eyes: slightly injected. Pupils equal, round, reactive to light, although mildly sluggish.there is significant hematoma to the upper eyelid. ENT: right hemotympanum. Left tympanic membrane normal Neck: braced Cardiovascular: regular rate normal S1-S2 sounds. No murmurs auscultated. Respiratory: good respiratory effort. Clear to auscultation bilaterally with no wheezes rales rhonchi. Abdomen: soft, nontender, nondistended. Bowel sounds. No guarding or rebound tenderness. No hepatosplenomegaly or masses palpated. Skin: no rashes or petechia Musculoskeletal: major joints nontender Psychiatric: appropriate Neurologic: cranial nerves II through XII grossly intact. Strength and upper lower extremities. No focal neurological observed.          Labs on Admission:  Basic Metabolic Panel:  Recent Labs Lab 12/11/13 2251  NA 138  K 4.4  CL 101  CO2 25  GLUCOSE 145*  BUN 11  CREATININE 0.77  CALCIUM 9.0   Liver Function Tests:  Recent Labs Lab 12/11/13 2251  AST 27  ALT 23  ALKPHOS 97  BILITOT 1.1  PROT 7.7  ALBUMIN 3.9   No results found for this basename: LIPASE, AMYLASE,  in the last 168 hours No results found for this basename: AMMONIA,  in the last 168 hours CBC:  Recent Labs Lab 12/11/13 2251  WBC 11.4*  NEUTROABS 10.1*  HGB 14.1  HCT 40.5  MCV 89.0  PLT 175   Cardiac Enzymes:  Recent Labs Lab 12/11/13 2251  TROPONINI <0.30    BNP (last 3  results) No results found for this basename: PROBNP,  in the last 8760 hours CBG: No results found for this basename: GLUCAP,  in the last 168 hours  Radiological Exams on Admission: Ct Angio Head W/cm &/or Wo Cm  12/12/2013   CLINICAL DATA:  Recent head trauma.  EXAM: CT ANGIOGRAPHY HEAD  TECHNIQUE: Multidetector CT imaging of the head was performed using the standard  protocol during bolus administration of intravenous contrast. Multiplanar CT image reconstructions and MIPs were obtained to evaluate the vascular anatomy.  CONTRAST:  80mL OMNIPAQUE IOHEXOL 350 MG/ML SOLN  COMPARISON:  CT of the head December 11, 2013 at 2345 hr.  FINDINGS: Anterior circulation: Normal appearance of the cervical internal carotid arteries, petrous, cavernous and supra clinoid internal carotid arteries. Widely patent anterior communicating artery. Normal appearance of the anterior and middle cerebral arteries. No early filling of the left cavernous sinus.  Posterior circulation: Left vertebral artery is dominant with normal appearance of the vertebral arteries, vertebrobasilar junction and basilar artery, as well as main branch vessels. Normal appearance of the posterior cerebral arteries.  No large vessel occlusion, hemodynamically significant stenosis, dissection, luminal irregularity, contrast extravasation or aneurysm within the anterior nor posterior circulation.  No abnormal parenchymal enhancement on delayed imaging. However, relative paucity of left transverse sinus enhancement. Left skullbase fracture better seen on prior bone windows. Sphenoid sinus opacification air-fluid level, similar.  Review of the MIP images confirms the above findings.  IMPRESSION: No acute vascular injury, no CT findings of carotid cavernous fistula.  Left skullbase fracture better seen on prior imaging. Sphenoid sinus opacification suggests underlying possible fracture.  Relative paucity of left transverse sinus enhancement, unclear if this reflects right-sided dominance though, given underlying fracture, dural venous sinus thrombosis not excluded.   Electronically Signed   By: Awilda Metroourtnay  Bloomer   On: 12/12/2013 00:57   Dg Cervical Spine Complete  12/11/2013   CLINICAL DATA:  Head and neck pain, status post fall.  Lethargy.  EXAM: CERVICAL SPINE  4+ VIEWS  COMPARISON:  None.  FINDINGS: There is no evidence of  fracture or subluxation. Vertebral bodies demonstrate normal height and alignment. Intervertebral disc spaces are preserved. Prevertebral soft tissues are within normal limits. The provided odontoid view demonstrates no significant abnormality.  The visualized lung apices are clear.  IMPRESSION: No evidence of fracture or subluxation along the cervical spine.   Electronically Signed   By: Roanna RaiderJeffery  Chang M.D.   On: 12/11/2013 23:42   Ct Head Wo Contrast  12/12/2013   CLINICAL DATA:  Status post fall. Hit head on pavement, with loss of consciousness. Bilateral periorbital bruising, vomiting, headache and bilateral neck pain.  EXAM: CT HEAD WITHOUT CONTRAST  CT MAXILLOFACIAL WITHOUT CONTRAST  TECHNIQUE: Multidetector CT imaging of the head and maxillofacial structures were performed using the standard protocol without intravenous contrast. Multiplanar CT image reconstructions of the maxillofacial structures were also generated.  COMPARISON:  None.  FINDINGS: CT HEAD FINDINGS  There is no evidence of acute infarction, mass lesion, or intra- or extra-axial hemorrhage on CT.  The posterior fossa, including the cerebellum, brainstem and fourth ventricle, is within normal limits. The third and lateral ventricles, and basal ganglia are unremarkable in appearance. The cerebral hemispheres are symmetric in appearance, with normal gray-white differentiation. No mass effect or midline shift is seen.  There is a minimally displaced fracture through left occiput, extending to the left side of the base of the skull. On maxillofacial images, extension through the canal for the internal  carotid artery is suspected. Blood is seen filling the sphenoid sinus.  The orbits are within normal limits. The remaining paranasal sinuses and mastoid air cells are well-aerated. No significant soft tissue abnormalities are seen.  CT MAXILLOFACIAL FINDINGS  There is a minimally displaced fracture through the left occiput, visualized as it extends  across the left side of the base of the skull. This is suspected to extend through the canal for the petrous portion of the left internal carotid artery. No additional fractures are seen.  The maxilla and mandible appear intact. The nasal bone is unremarkable in appearance. The visualized dentition demonstrates no acute abnormality.  The orbits are intact bilaterally. Blood is seen filling the sphenoid sinus. The remaining visualized paranasal sinuses and mastoid air cells are well-aerated.  Minimal air noted at the level of the foramen magnum, anterior to the spinal canal, may reflect trace air from a peripheral IV catheter. The parapharyngeal fat planes are preserved. The nasopharynx, oropharynx and hypopharynx are unremarkable in appearance. The visualized portions of the valleculae and piriform sinuses are grossly unremarkable.  The parotid and submandibular glands are within normal limits. No cervical lymphadenopathy is seen.  IMPRESSION: 1. No evidence of traumatic intracranial injury. 2. Minimally displaced fracture extending through the left occiput, down to the left side at the base of the skull. This is suspected to extend through the canal for the petrous portion of the left internal carotid artery. Blood seen filling the sphenoid sinus. CTA of the head is recommended for further evaluation, to exclude injury to the left internal carotid artery. 3. No evidence of fracture or dislocation with regard to the maxillofacial structures.  These results were called by telephone at the time of interpretation on 12/11/2013 at 11:57 pm to [facility], who verbally acknowledged these results.   Electronically Signed   By: Roanna Raider M.D.   On: 12/12/2013 00:04   Ct Maxillofacial Wo Cm  12/12/2013   CLINICAL DATA:  Status post fall. Hit head on pavement, with loss of consciousness. Bilateral periorbital bruising, vomiting, headache and bilateral neck pain.  EXAM: CT HEAD WITHOUT CONTRAST  CT MAXILLOFACIAL  WITHOUT CONTRAST  TECHNIQUE: Multidetector CT imaging of the head and maxillofacial structures were performed using the standard protocol without intravenous contrast. Multiplanar CT image reconstructions of the maxillofacial structures were also generated.  COMPARISON:  None.  FINDINGS: CT HEAD FINDINGS  There is no evidence of acute infarction, mass lesion, or intra- or extra-axial hemorrhage on CT.  The posterior fossa, including the cerebellum, brainstem and fourth ventricle, is within normal limits. The third and lateral ventricles, and basal ganglia are unremarkable in appearance. The cerebral hemispheres are symmetric in appearance, with normal gray-white differentiation. No mass effect or midline shift is seen.  There is a minimally displaced fracture through left occiput, extending to the left side of the base of the skull. On maxillofacial images, extension through the canal for the internal carotid artery is suspected. Blood is seen filling the sphenoid sinus.  The orbits are within normal limits. The remaining paranasal sinuses and mastoid air cells are well-aerated. No significant soft tissue abnormalities are seen.  CT MAXILLOFACIAL FINDINGS  There is a minimally displaced fracture through the left occiput, visualized as it extends across the left side of the base of the skull. This is suspected to extend through the canal for the petrous portion of the left internal carotid artery. No additional fractures are seen.  The maxilla and mandible appear intact. The nasal  bone is unremarkable in appearance. The visualized dentition demonstrates no acute abnormality.  The orbits are intact bilaterally. Blood is seen filling the sphenoid sinus. The remaining visualized paranasal sinuses and mastoid air cells are well-aerated.  Minimal air noted at the level of the foramen magnum, anterior to the spinal canal, may reflect trace air from a peripheral IV catheter. The parapharyngeal fat planes are preserved. The  nasopharynx, oropharynx and hypopharynx are unremarkable in appearance. The visualized portions of the valleculae and piriform sinuses are grossly unremarkable.  The parotid and submandibular glands are within normal limits. No cervical lymphadenopathy is seen.  IMPRESSION: 1. No evidence of traumatic intracranial injury. 2. Minimally displaced fracture extending through the left occiput, down to the left side at the base of the skull. This is suspected to extend through the canal for the petrous portion of the left internal carotid artery. Blood seen filling the sphenoid sinus. CTA of the head is recommended for further evaluation, to exclude injury to the left internal carotid artery. 3. No evidence of fracture or dislocation with regard to the maxillofacial structures.  These results were called by telephone at the time of interpretation on 12/11/2013 at 11:57 pm to [facility], who verbally acknowledged these results.   Electronically Signed   By: Roanna Raider M.D.   On: 12/12/2013 00:04    EKG: Independently reviewed. Normal sinus rhythm with a rate of 95. Normal QRS complexes.   Assessment/Plan Present on Admission:  . Basilar skull fracture . Syncopal episodes  #1 basilar skull fracture. admit for observation at [facility] Neurosurgery to consult tomorrow - case was discussed with them by ED doctor Keep n.p.o. Morphine for pain Zofran for nausea  #2 syncopal episodes CBC and CMP are normal. Will check TSH tomorrow. Will keep on telemetry. May need Holter monitor or event monitor on discharge  Consultants: neurosurgery  Code Status: full  Family Communication: sister  Disposition Plan: pending evaluation from neurosurgery  Time spent: 50 minutes  Candelaria Celeste, [place] [facility] Pager [phone]  **Disclaimer: This note may have been dictated with voice recognition software. Similar sounding words can inadvertently be transcribed and this note may contain  transcription errors which may not have been corrected upon publication of note.**

## 2013-12-12 NOTE — Consult Note (Signed)
Reason for Consult: Closed head injury with syncopal episode. Referring Physician: Dr. Orlando Penner Stanley Greer is an 43 y.o. male.  HPI: Patient is a 43 year old male who apparently had an near-syncopal episode yesterday falling striking the back of his head. He sustained a basilar skull fracture through the left side of the occiput. Is developed a Battle sign and raccoon eyes. This occurred about 1:30 in the afternoon yesterday. He refused to go to the emergency room despite feeling ill nauseous with headache. He was ultimately seen in the emergency room early this morning. He is admitted for syncopal workup. CT scan was reviewed which demonstrates no evidence of an intracranial parenchymal injury however he does have evidence of a basilar skull fracture is noted. Cervical spine MRI is within limits of normal.  History reviewed. No pertinent past medical history.  Past Surgical History  Procedure Laterality Date  . Vasectomy  2004  . Laparoscopic appendectomy N/A 04/13/2013    Procedure: APPENDECTOMY LAPAROSCOPIC;  Surgeon: Jamesetta So, MD;  Location: [facility];  Service: General;  Laterality: N/A;    History reviewed. No pertinent family history.  Social History:  reports that he has never smoked. He does not have any smokeless tobacco history on file. He reports that he drinks alcohol. He reports that he does not use illicit drugs.  Allergies: No Known Allergies  Medications: Have not reviewed the patient's medication list  Results for orders placed during the hospital encounter of 12/11/13 (from the past 48 hour(s))  PROTIME-INR     Status: None   Collection Time    12/11/13 10:51 PM      Result Value Ref Range   Prothrombin Time 14.3  11.6 - 15.2 seconds   INR 1.11  0.00 - 1.49  COMPREHENSIVE METABOLIC PANEL     Status: Abnormal   Collection Time    12/11/13 10:51 PM      Result Value Ref Range   Sodium 138  137 - 147 mEq/L   Potassium 4.4  3.7 - 5.3 mEq/L   Chloride 101  96 -  112 mEq/L   CO2 25  19 - 32 mEq/L   Glucose, Bld 145 (*) 70 - 99 mg/dL   BUN 11  6 - 23 mg/dL   Creatinine, Ser 0.77  0.50 - 1.35 mg/dL   Calcium 9.0  8.4 - 10.5 mg/dL   Total Protein 7.7  6.0 - 8.3 g/dL   Albumin 3.9  3.5 - 5.2 g/dL   AST 27  0 - 37 U/L   ALT 23  0 - 53 U/L   Alkaline Phosphatase 97  39 - 117 U/L   Total Bilirubin 1.1  0.3 - 1.2 mg/dL   GFR calc non Af Amer >90  >90 mL/min   GFR calc Af Amer >90  >90 mL/min   Comment: (NOTE)     The eGFR has been calculated using the CKD EPI equation.     This calculation has not been validated in all clinical situations.     eGFR's persistently <90 mL/min signify possible Chronic Kidney     Disease.   Anion gap 12  5 - 15  CBC WITH DIFFERENTIAL     Status: Abnormal   Collection Time    12/11/13 10:51 PM      Result Value Ref Range   WBC 11.4 (*) 4.0 - 10.5 K/uL   RBC 4.55  4.22 - 5.81 MIL/uL   Hemoglobin 14.1  13.0 - 17.0 g/dL  HCT 40.5  39.0 - 52.0 %   MCV 89.0  78.0 - 100.0 fL   MCH 31.0  26.0 - 34.0 pg   MCHC 34.8  30.0 - 36.0 g/dL   RDW 13.3  11.5 - 15.5 %   Platelets 175  150 - 400 K/uL   Neutrophils Relative % 89 (*) 43 - 77 %   Neutro Abs 10.1 (*) 1.7 - 7.7 K/uL   Lymphocytes Relative 6 (*) 12 - 46 %   Lymphs Abs 0.7  0.7 - 4.0 K/uL   Monocytes Relative 5  3 - 12 %   Monocytes Absolute 0.6  0.1 - 1.0 K/uL   Eosinophils Relative 0  0 - 5 %   Eosinophils Absolute 0.0  0.0 - 0.7 K/uL   Basophils Relative 0  0 - 1 %   Basophils Absolute 0.0  0.0 - 0.1 K/uL  TROPONIN I     Status: None   Collection Time    12/11/13 10:51 PM      Result Value Ref Range   Troponin I <0.30  <0.30 ng/mL   Comment:            Due to the release kinetics of cTnI,     a negative result within the first hours     of the onset of symptoms does not rule out     myocardial infarction with certainty.     If myocardial infarction is still suspected,     repeat the test at appropriate intervals.  CBC     Status: Abnormal   Collection  Time    12/12/13  8:00 AM      Result Value Ref Range   WBC 10.7 (*) 4.0 - 10.5 K/uL   RBC 4.44  4.22 - 5.81 MIL/uL   Hemoglobin 13.6  13.0 - 17.0 g/dL   HCT 40.7  39.0 - 52.0 %   MCV 91.7  78.0 - 100.0 fL   MCH 30.6  26.0 - 34.0 pg   MCHC 33.4  30.0 - 36.0 g/dL   RDW 13.7  11.5 - 15.5 %   Platelets 169  150 - 400 K/uL  BASIC METABOLIC PANEL     Status: Abnormal   Collection Time    12/12/13  8:00 AM      Result Value Ref Range   Sodium 137  137 - 147 mEq/L   Potassium 4.1  3.7 - 5.3 mEq/L   Chloride 102  96 - 112 mEq/L   CO2 22  19 - 32 mEq/L   Glucose, Bld 115 (*) 70 - 99 mg/dL   BUN 9  6 - 23 mg/dL   Creatinine, Ser 0.68  0.50 - 1.35 mg/dL   Calcium 8.9  8.4 - 10.5 mg/dL   GFR calc non Af Amer >90  >90 mL/min   GFR calc Af Amer >90  >90 mL/min   Comment: (NOTE)     The eGFR has been calculated using the CKD EPI equation.     This calculation has not been validated in all clinical situations.     eGFR's persistently <90 mL/min signify possible Chronic Kidney     Disease.   Anion gap 13  5 - 15  TSH     Status: None   Collection Time    12/12/13  8:00 AM      Result Value Ref Range   TSH 0.414  0.350 - 4.500 uIU/mL  URINE RAPID DRUG SCREEN (HOSP PERFORMED)  Status: Abnormal   Collection Time    12/12/13  8:10 AM      Result Value Ref Range   Opiates POSITIVE (*) NONE DETECTED   Cocaine NONE DETECTED  NONE DETECTED   Benzodiazepines NONE DETECTED  NONE DETECTED   Amphetamines POSITIVE (*) NONE DETECTED   Tetrahydrocannabinol POSITIVE (*) NONE DETECTED   Barbiturates NONE DETECTED  NONE DETECTED   Comment:            DRUG SCREEN FOR MEDICAL PURPOSES     ONLY.  IF CONFIRMATION IS NEEDED     FOR ANY PURPOSE, NOTIFY LAB     WITHIN 5 DAYS.                LOWEST DETECTABLE LIMITS     FOR URINE DRUG SCREEN     Drug Class       Cutoff (ng/mL)     Amphetamine      1000     Barbiturate      200     Benzodiazepine   878     Tricyclics       676     Opiates           300     Cocaine          300     THC              50  URINALYSIS, ROUTINE W REFLEX MICROSCOPIC     Status: Abnormal   Collection Time    12/12/13  8:10 AM      Result Value Ref Range   Color, Urine YELLOW  YELLOW   APPearance CLEAR  CLEAR   Specific Gravity, Urine >1.046 (*) 1.005 - 1.030   pH 7.0  5.0 - 8.0   Glucose, UA NEGATIVE  NEGATIVE mg/dL   Hgb urine dipstick TRACE (*) NEGATIVE   Bilirubin Urine NEGATIVE  NEGATIVE   Ketones, ur 15 (*) NEGATIVE mg/dL   Protein, ur NEGATIVE  NEGATIVE mg/dL   Urobilinogen, UA 0.2  0.0 - 1.0 mg/dL   Nitrite NEGATIVE  NEGATIVE   Leukocytes, UA NEGATIVE  NEGATIVE  URINE MICROSCOPIC-ADD ON     Status: None   Collection Time    12/12/13  8:10 AM      Result Value Ref Range   RBC / HPF 0-2  <3 RBC/hpf    Ct Angio Head W/cm &/or Wo Cm  12/12/2013   CLINICAL DATA:  Recent head trauma.  EXAM: CT ANGIOGRAPHY HEAD  TECHNIQUE: Multidetector CT imaging of the head was performed using the standard protocol during bolus administration of intravenous contrast. Multiplanar CT image reconstructions and MIPs were obtained to evaluate the vascular anatomy.  CONTRAST:  25m OMNIPAQUE IOHEXOL 350 MG/ML SOLN  COMPARISON:  CT of the head December 11, 2013 at 2345 hr.  FINDINGS: Anterior circulation: Normal appearance of the cervical internal carotid arteries, petrous, cavernous and supra clinoid internal carotid arteries. Widely patent anterior communicating artery. Normal appearance of the anterior and middle cerebral arteries. No early filling of the left cavernous sinus.  Posterior circulation: Left vertebral artery is dominant with normal appearance of the vertebral arteries, vertebrobasilar junction and basilar artery, as well as main branch vessels. Normal appearance of the posterior cerebral arteries.  No large vessel occlusion, hemodynamically significant stenosis, dissection, luminal irregularity, contrast extravasation or aneurysm within the anterior nor posterior  circulation.  No abnormal parenchymal enhancement on delayed imaging. However, relative paucity of left transverse sinus  enhancement. Left skullbase fracture better seen on prior bone windows. Sphenoid sinus opacification air-fluid level, similar.  Review of the MIP images confirms the above findings.  IMPRESSION: No acute vascular injury, no CT findings of carotid cavernous fistula.  Left skullbase fracture better seen on prior imaging. Sphenoid sinus opacification suggests underlying possible fracture.  Relative paucity of left transverse sinus enhancement, unclear if this reflects right-sided dominance though, given underlying fracture, dural venous sinus thrombosis not excluded.   Electronically Signed   By: Elon Alas   On: 12/12/2013 00:57   Dg Cervical Spine Complete  12/11/2013   CLINICAL DATA:  Head and neck pain, status post fall.  Lethargy.  EXAM: CERVICAL SPINE  4+ VIEWS  COMPARISON:  None.  FINDINGS: There is no evidence of fracture or subluxation. Vertebral bodies demonstrate normal height and alignment. Intervertebral disc spaces are preserved. Prevertebral soft tissues are within normal limits. The provided odontoid view demonstrates no significant abnormality.  The visualized lung apices are clear.  IMPRESSION: No evidence of fracture or subluxation along the cervical spine.   Electronically Signed   By: Garald Balding M.D.   On: 12/11/2013 23:42   Ct Head Wo Contrast  12/12/2013   CLINICAL DATA:  Status post fall. Hit head on pavement, with loss of consciousness. Bilateral periorbital bruising, vomiting, headache and bilateral neck pain.  EXAM: CT HEAD WITHOUT CONTRAST  CT MAXILLOFACIAL WITHOUT CONTRAST  TECHNIQUE: Multidetector CT imaging of the head and maxillofacial structures were performed using the standard protocol without intravenous contrast. Multiplanar CT image reconstructions of the maxillofacial structures were also generated.  COMPARISON:  None.  FINDINGS: CT HEAD  FINDINGS  There is no evidence of acute infarction, mass lesion, or intra- or extra-axial hemorrhage on CT.  The posterior fossa, including the cerebellum, brainstem and fourth ventricle, is within normal limits. The third and lateral ventricles, and basal ganglia are unremarkable in appearance. The cerebral hemispheres are symmetric in appearance, with normal gray-white differentiation. No mass effect or midline shift is seen.  There is a minimally displaced fracture through left occiput, extending to the left side of the base of the skull. On maxillofacial images, extension through the canal for the internal carotid artery is suspected. Blood is seen filling the sphenoid sinus.  The orbits are within normal limits. The remaining paranasal sinuses and mastoid air cells are well-aerated. No significant soft tissue abnormalities are seen.  CT MAXILLOFACIAL FINDINGS  There is a minimally displaced fracture through the left occiput, visualized as it extends across the left side of the base of the skull. This is suspected to extend through the canal for the petrous portion of the left internal carotid artery. No additional fractures are seen.  The maxilla and mandible appear intact. The nasal bone is unremarkable in appearance. The visualized dentition demonstrates no acute abnormality.  The orbits are intact bilaterally. Blood is seen filling the sphenoid sinus. The remaining visualized paranasal sinuses and mastoid air cells are well-aerated.  Minimal air noted at the level of the foramen magnum, anterior to the spinal canal, may reflect trace air from a peripheral IV catheter. The parapharyngeal fat planes are preserved. The nasopharynx, oropharynx and hypopharynx are unremarkable in appearance. The visualized portions of the valleculae and piriform sinuses are grossly unremarkable.  The parotid and submandibular glands are within normal limits. No cervical lymphadenopathy is seen.  IMPRESSION: 1. No evidence of  traumatic intracranial injury. 2. Minimally displaced fracture extending through the left occiput, down to the left  side at the base of the skull. This is suspected to extend through the canal for the petrous portion of the left internal carotid artery. Blood seen filling the sphenoid sinus. CTA of the head is recommended for further evaluation, to exclude injury to the left internal carotid artery. 3. No evidence of fracture or dislocation with regard to the maxillofacial structures.  These results were called by telephone at the time of interpretation on 12/11/2013 at 11:57 pm to [facility], who verbally acknowledged these results.   Electronically Signed   By: Garald Balding M.D.   On: 12/12/2013 00:04   Ct Maxillofacial Wo Cm  12/12/2013   CLINICAL DATA:  Status post fall. Hit head on pavement, with loss of consciousness. Bilateral periorbital bruising, vomiting, headache and bilateral neck pain.  EXAM: CT HEAD WITHOUT CONTRAST  CT MAXILLOFACIAL WITHOUT CONTRAST  TECHNIQUE: Multidetector CT imaging of the head and maxillofacial structures were performed using the standard protocol without intravenous contrast. Multiplanar CT image reconstructions of the maxillofacial structures were also generated.  COMPARISON:  None.  FINDINGS: CT HEAD FINDINGS  There is no evidence of acute infarction, mass lesion, or intra- or extra-axial hemorrhage on CT.  The posterior fossa, including the cerebellum, brainstem and fourth ventricle, is within normal limits. The third and lateral ventricles, and basal ganglia are unremarkable in appearance. The cerebral hemispheres are symmetric in appearance, with normal gray-white differentiation. No mass effect or midline shift is seen.  There is a minimally displaced fracture through left occiput, extending to the left side of the base of the skull. On maxillofacial images, extension through the canal for the internal carotid artery is suspected. Blood is seen filling the  sphenoid sinus.  The orbits are within normal limits. The remaining paranasal sinuses and mastoid air cells are well-aerated. No significant soft tissue abnormalities are seen.  CT MAXILLOFACIAL FINDINGS  There is a minimally displaced fracture through the left occiput, visualized as it extends across the left side of the base of the skull. This is suspected to extend through the canal for the petrous portion of the left internal carotid artery. No additional fractures are seen.  The maxilla and mandible appear intact. The nasal bone is unremarkable in appearance. The visualized dentition demonstrates no acute abnormality.  The orbits are intact bilaterally. Blood is seen filling the sphenoid sinus. The remaining visualized paranasal sinuses and mastoid air cells are well-aerated.  Minimal air noted at the level of the foramen magnum, anterior to the spinal canal, may reflect trace air from a peripheral IV catheter. The parapharyngeal fat planes are preserved. The nasopharynx, oropharynx and hypopharynx are unremarkable in appearance. The visualized portions of the valleculae and piriform sinuses are grossly unremarkable.  The parotid and submandibular glands are within normal limits. No cervical lymphadenopathy is seen.  IMPRESSION: 1. No evidence of traumatic intracranial injury. 2. Minimally displaced fracture extending through the left occiput, down to the left side at the base of the skull. This is suspected to extend through the canal for the petrous portion of the left internal carotid artery. Blood seen filling the sphenoid sinus. CTA of the head is recommended for further evaluation, to exclude injury to the left internal carotid artery. 3. No evidence of fracture or dislocation with regard to the maxillofacial structures.  These results were called by telephone at the time of interpretation on 12/11/2013 at 11:57 pm to [facility], who verbally acknowledged these results.   Electronically Signed   By:  Jacqulynn Cadet  Chang M.D.   On: 12/12/2013 00:04    Review of Systems  Unable to perform ROS: mental acuity   Blood pressure 116/84, pulse 77, temperature 98.5 F (36.9 C), temperature source Oral, resp. rate 18, height _0  (1.803 m), weight 79.5 kg (175 lb 4.3 oz), SpO2 95.00%. Physical Exam  Constitutional: He appears well-developed and well-nourished.  Neurological:  Patient arouses to voice. He follows commands, moves all 4 extremities well. Does not demonstrate any strength deficit. He has evidence of raccoon eyes that is ecchymosis around the lids of both eyes secondary to his basilar skull fracture. He also has a battle sign. It is ecchymosis behind the ears. His neck demonstrates 1+ meningismus. Pupils are 3 mm and briskly reactive light accommodation extraocular movements are full the face is symmetric. Tongue and uvula protruded in the midline.  Skin: Skin is warm and dry.    Assessment/Plan: Closed head injury with basilar skull fracture. A repeat CT scan should BE performed about 24 hours after the first scan to rule out any parenchymal injury that may occur in a delayed fashion. Syncopal workup as per the medical service.  Catherin Doorn J 12/12/2013, 11:26 AM

## 2013-12-12 NOTE — ED Provider Notes (Signed)
43-year-old male and had a syncopal episode and hit his head on pavement. He was brought to the emergency department is somewhat somnolent and slightly disoriented. On exam, he has bilateral turgor ecchymosis. Right sided hemotympanum is present as well as spot in the right nostril. Is oriented to person and place but not time. There are no focal neurologic findings. CT shows a basilar skull fracture on the left with blood in the sphenoid sinus. He is being sent for CT angiogram to make sure there is no vascular injury.  Medical screening examination/treatment/procedure(s) were conducted as a shared visit with non-physician practitioner(s) and myself.  I personally evaluated the patient during the encounter.   EKG Interpretation   Date/Time:  Saturday December 11 2013 22:52:18 EDT Ventricular Rate:  84 PR Interval:  123 QRS Duration: 116 QT Interval:  391 QTC Calculation: 462 R Axis:   -9 Text Interpretation:  Sinus rhythm Nonspecific intraventricular conduction  delay ST elev, probable normal early repol pattern No previous tracing  Confirmed by KNAPP  MD-J, JON ([id number]) on 12/11/2013 10:54:56 PM      Results for orders placed during the hospital encounter of 12/11/13  PROTIME-INR      Result Value Ref Range   Prothrombin Time 14.3  11.6 - 15.2 seconds   INR 1.11  0.00 - 1.49  COMPREHENSIVE METABOLIC PANEL      Result Value Ref Range   Sodium 138  137 - 147 mEq/L   Potassium 4.4  3.7 - 5.3 mEq/L   Chloride 101  96 - 112 mEq/L   CO2 25  19 - 32 mEq/L   Glucose, Bld 145 (*) 70 - 99 mg/dL   BUN 11  6 - 23 mg/dL   Creatinine, Ser 6.040.77  0.50 - 1.35 mg/dL   Calcium 9.0  8.4 - 54.010.5 mg/dL   Total Protein 7.7  6.0 - 8.3 g/dL   Albumin 3.9  3.5 - 5.2 g/dL   AST 27  0 - 37 U/L   ALT 23  0 - 53 U/L   Alkaline Phosphatase 97  39 - 117 U/L   Total Bilirubin 1.1  0.3 - 1.2 mg/dL   GFR calc non Af Amer >90  >90 mL/min   GFR calc Af Amer >90  >90 mL/min   Anion gap 12  5 - 15  CBC WITH  DIFFERENTIAL      Result Value Ref Range   WBC 11.4 (*) 4.0 - 10.5 K/uL   RBC 4.55  4.22 - 5.81 MIL/uL   Hemoglobin 14.1  13.0 - 17.0 g/dL   HCT 98.140.5  19.139.0 - 47.852.0 %   MCV 89.0  78.0 - 100.0 fL   MCH 31.0  26.0 - 34.0 pg   MCHC 34.8  30.0 - 36.0 g/dL   RDW 29.513.3  62.111.5 - 30.815.5 %   Platelets 175  150 - 400 K/uL   Neutrophils Relative % 89 (*) 43 - 77 %   Neutro Abs 10.1 (*) 1.7 - 7.7 K/uL   Lymphocytes Relative 6 (*) 12 - 46 %   Lymphs Abs 0.7  0.7 - 4.0 K/uL   Monocytes Relative 5  3 - 12 %   Monocytes Absolute 0.6  0.1 - 1.0 K/uL   Eosinophils Relative 0  0 - 5 %   Eosinophils Absolute 0.0  0.0 - 0.7 K/uL   Basophils Relative 0  0 - 1 %   Basophils Absolute 0.0  0.0 - 0.1 K/uL  TROPONIN  I      Result Value Ref Range   Troponin I <0.30  <0.30 ng/mL   Dg Cervical Spine Complete  12/11/2013   CLINICAL DATA:  Head and neck pain, status post fall.  Lethargy.  EXAM: CERVICAL SPINE  4+ VIEWS  COMPARISON:  None.  FINDINGS: There is no evidence of fracture or subluxation. Vertebral bodies demonstrate normal height and alignment. Intervertebral disc spaces are preserved. Prevertebral soft tissues are within normal limits. The provided odontoid view demonstrates no significant abnormality.  The visualized lung apices are clear.  IMPRESSION: No evidence of fracture or subluxation along the cervical spine.   Electronically Signed   By: Roanna Raider M.D.   On: 12/11/2013 23:42   Ct Head Wo Contrast  12/12/2013   CLINICAL DATA:  Status post fall. Hit head on pavement, with loss of consciousness. Bilateral periorbital bruising, vomiting, headache and bilateral neck pain.  EXAM: CT HEAD WITHOUT CONTRAST  CT MAXILLOFACIAL WITHOUT CONTRAST  TECHNIQUE: Multidetector CT imaging of the head and maxillofacial structures were performed using the standard protocol without intravenous contrast. Multiplanar CT image reconstructions of the maxillofacial structures were also generated.  COMPARISON:  None.  FINDINGS:  CT HEAD FINDINGS  There is no evidence of acute infarction, mass lesion, or intra- or extra-axial hemorrhage on CT.  The posterior fossa, including the cerebellum, brainstem and fourth ventricle, is within normal limits. The third and lateral ventricles, and basal ganglia are unremarkable in appearance. The cerebral hemispheres are symmetric in appearance, with normal gray-white differentiation. No mass effect or midline shift is seen.  There is a minimally displaced fracture through left occiput, extending to the left side of the base of the skull. On maxillofacial images, extension through the canal for the internal carotid artery is suspected. Blood is seen filling the sphenoid sinus.  The orbits are within normal limits. The remaining paranasal sinuses and mastoid air cells are well-aerated. No significant soft tissue abnormalities are seen.  CT MAXILLOFACIAL FINDINGS  There is a minimally displaced fracture through the left occiput, visualized as it extends across the left side of the base of the skull. This is suspected to extend through the canal for the petrous portion of the left internal carotid artery. No additional fractures are seen.  The maxilla and mandible appear intact. The nasal bone is unremarkable in appearance. The visualized dentition demonstrates no acute abnormality.  The orbits are intact bilaterally. Blood is seen filling the sphenoid sinus. The remaining visualized paranasal sinuses and mastoid air cells are well-aerated.  Minimal air noted at the level of the foramen magnum, anterior to the spinal canal, may reflect trace air from a peripheral IV catheter. The parapharyngeal fat planes are preserved. The nasopharynx, oropharynx and hypopharynx are unremarkable in appearance. The visualized portions of the valleculae and piriform sinuses are grossly unremarkable.  The parotid and submandibular glands are within normal limits. No cervical lymphadenopathy is seen.  IMPRESSION: 1. No evidence  of traumatic intracranial injury. 2. Minimally displaced fracture extending through the left occiput, down to the left side at the base of the skull. This is suspected to extend through the canal for the petrous portion of the left internal carotid artery. Blood seen filling the sphenoid sinus. CTA of the head is recommended for further evaluation, to exclude injury to the left internal carotid artery. 3. No evidence of fracture or dislocation with regard to the maxillofacial structures.  These results were called by telephone at the time of interpretation on 12/11/2013  at 11:57 pm to [facility], who verbally acknowledged these results.   Electronically Signed   By: Roanna Raider M.D.   On: 12/12/2013 00:04   Ct Maxillofacial Wo Cm  12/12/2013   CLINICAL DATA:  Status post fall. Hit head on pavement, with loss of consciousness. Bilateral periorbital bruising, vomiting, headache and bilateral neck pain.  EXAM: CT HEAD WITHOUT CONTRAST  CT MAXILLOFACIAL WITHOUT CONTRAST  TECHNIQUE: Multidetector CT imaging of the head and maxillofacial structures were performed using the standard protocol without intravenous contrast. Multiplanar CT image reconstructions of the maxillofacial structures were also generated.  COMPARISON:  None.  FINDINGS: CT HEAD FINDINGS  There is no evidence of acute infarction, mass lesion, or intra- or extra-axial hemorrhage on CT.  The posterior fossa, including the cerebellum, brainstem and fourth ventricle, is within normal limits. The third and lateral ventricles, and basal ganglia are unremarkable in appearance. The cerebral hemispheres are symmetric in appearance, with normal gray-white differentiation. No mass effect or midline shift is seen.  There is a minimally displaced fracture through left occiput, extending to the left side of the base of the skull. On maxillofacial images, extension through the canal for the internal carotid artery is suspected. Blood is seen filling the  sphenoid sinus.  The orbits are within normal limits. The remaining paranasal sinuses and mastoid air cells are well-aerated. No significant soft tissue abnormalities are seen.  CT MAXILLOFACIAL FINDINGS  There is a minimally displaced fracture through the left occiput, visualized as it extends across the left side of the base of the skull. This is suspected to extend through the canal for the petrous portion of the left internal carotid artery. No additional fractures are seen.  The maxilla and mandible appear intact. The nasal bone is unremarkable in appearance. The visualized dentition demonstrates no acute abnormality.  The orbits are intact bilaterally. Blood is seen filling the sphenoid sinus. The remaining visualized paranasal sinuses and mastoid air cells are well-aerated.  Minimal air noted at the level of the foramen magnum, anterior to the spinal canal, may reflect trace air from a peripheral IV catheter. The parapharyngeal fat planes are preserved. The nasopharynx, oropharynx and hypopharynx are unremarkable in appearance. The visualized portions of the valleculae and piriform sinuses are grossly unremarkable.  The parotid and submandibular glands are within normal limits. No cervical lymphadenopathy is seen.  IMPRESSION: 1. No evidence of traumatic intracranial injury. 2. Minimally displaced fracture extending through the left occiput, down to the left side at the base of the skull. This is suspected to extend through the canal for the petrous portion of the left internal carotid artery. Blood seen filling the sphenoid sinus. CTA of the head is recommended for further evaluation, to exclude injury to the left internal carotid artery. 3. No evidence of fracture or dislocation with regard to the maxillofacial structures.  These results were called by telephone at the time of interpretation on 12/11/2013 at 11:57 pm to [facility], who verbally acknowledged these results.   Electronically Signed   By:  Roanna Raider M.D.   On: 12/12/2013 00:04   Images viewed by me, discussed with radiologist.   Dione Booze, MD 12/12/13 [phone]

## 2013-12-12 NOTE — Progress Notes (Signed)
   [facility]                                                                              Patient Demographics  Stanley Greer, is a 43 y.o. male, DOB - Apr 16, 1971, YQM:[id number]  Admit date - 12/11/2013   Admitting Physician Levie HeritageJacob J Stinson, DO  Outpatient Primary MD for the patient is Cassell SmilesFUSCO,LAWRENCE J., MD  LOS - 1   Chief Complaint  Patient presents with  . Fall      HPI on 12/12/2013 Stanley Greer is a 43 y.o. male who is otherwise healthy, who presented after an unwitnessed syncopal episode and fall. His sister was nearby. The patient had struck his head. EMS was called by his sister who evaluated him and brought him to the emergency department. The patient complained of head pain, that was improved with morphine. Additionally, patient had nausea. He admitted to some photosensitivity. He has no recollection of the event. He did admit to previous episodes of syncope, which had never been evaluated. These events have no warning. He denied palpitations, irregular heartbeat, presyncope.    Assessment & Plan   Patient admitted early this morning, by Dr. Candelaria CelesteJacob Stinson. Recurrent assessment and plan.  Syncopal episode -Unknown etiology -Obtain orthostatic vitals, echocardiogram, carotid Doppler -CT: -Continue neuro check -Will obtain toxicology screen  Basilar skull fracture -CT of the head: -Neurosurgery consulted and appreciated -Continue antiemetics and pain medication as needed  Code Status: Full  Family Communication: Family at bedside  Disposition Plan: Admitted for observation, pending recommendations by neurosurgery  Time Spent in minutes   30 minutes  Procedures  None  Consults   Neurosurgery  DVT Prophylaxis  SCDs  Kevyn Boquet D.O. on 12/12/2013 at 11:14 AM  Between 7am to 7pm - Pager - [phone]  After 7pm go to [email or web address] - password [email or web address]  And look for the night coverage person covering for me after hours  [facility]  Group Office  [phone]

## 2013-12-12 NOTE — Progress Notes (Signed)
Utilization Review Completed.Stanley Greer T8/16/2015  

## 2013-12-12 NOTE — Evaluation (Signed)
Physical Therapy Evaluation Patient Details Name: Stanley Greer MRN: [id number] DOB: [birth year] Today's Date: 12/12/2013   History of Present Illness  Admitted after syncopal episode with fall and basilar skiull fracture.  Clinical Impression  43 yo M admitted with above issues. Present to PT "swimmyheaded" when getting up, but not due to BP. Pt declined further activity after sitting up in bed. Encouraged pt to get OOB with nursing staff later. Discussed concussions with pt and return to activity.  Pt likely will not require OT consult.  PT will return tomorrow to see if pt will participate in additional mobility.            Follow Up Recommendations No PT follow up;Supervision for mobility/OOB    Equipment Recommendations  None recommended by PT    Recommendations for Other Services       Precautions / Restrictions Precautions Precautions: Fall Restrictions Weight Bearing Restrictions: No      Mobility  Bed Mobility Overal bed mobility: Modified Independent             General bed mobility comments: used bed rail tp pull himself up  Transfers Overall transfer level:  (NT. Pt refused more activity than sitting up in bed)                  Ambulation/Gait                Stairs            Wheelchair Mobility    Modified Rankin (Stroke Patients Only)       Balance                                             Pertinent Vitals/Pain Pain Assessment:  (pt reports headache but did not rate)    Home Living Family/patient expects to be discharged to:: Private residence                 Additional Comments: Pt did not provide any details of his home situation during session.      Prior Function Level of Independence: Independent               Hand Dominance        Extremity/Trunk Assessment   Upper Extremity Assessment: Overall WFL for tasks assessed           Lower Extremity Assessment: Overall WFL  for tasks assessed      Cervical / Trunk Assessment: Normal  Communication   Communication: No difficulties  Cognition Arousal/Alertness: Awake/alert Behavior During Therapy: Agitated (pt minimally communicative during.Appeared agitated at PT) Overall Cognitive Status: Difficult to assess                      General Comments General comments (skin integrity, edema, etc.): Pt kept eyes closed majority of session. Pt agreed to PT eval and getting OOB, but when he sat up he stated he felt swimmyheaded and laid back down. He did sit up one more time for me to check his BP, but again would not get OOB.      Exercises        Assessment/Plan    PT Assessment Patient needs continued PT services  PT Diagnosis Difficulty walking   PT Problem List Decreased mobility;Decreased activity tolerance  PT Treatment Interventions Gait training;Balance training;Patient/family education;Therapeutic activities  PT Goals (Current goals can be found in the Care Plan section) Acute Rehab PT Goals Patient Stated Goal: Pt did not state PT Goal Formulation: With patient Time For Goal Achievement: 12/19/13 Potential to Achieve Goals: Good    Frequency Min 3X/week   Barriers to discharge        Co-evaluation               End of Session            Functional Assessment Tool Used: clinical judgement Functional Limitation: Mobility: Walking and moving around Mobility: Walking and Moving Around Current Status (W0981): At least 1 percent but less than 20 percent impaired, limited or restricted Mobility: Walking and Moving Around Goal Status [phone]): 0 percent impaired, limited or restricted    Time: 8295-6213 PT Time Calculation (min): 20 min   Charges:   PT Evaluation $Initial PT Evaluation Tier I: 1 Procedure     PT G Codes:   Functional Assessment Tool Used: clinical judgement Functional Limitation: Mobility: Walking and moving around    Donnella Sham 12/12/2013,  4:09 PM Lavona Mound, PT  [phone] 12/12/2013

## 2013-12-12 NOTE — ED Notes (Signed)
Pt refused to try to use urinal while laying in bed, and doesn't want to have in and out catheter to drain bladder, explained to him that it was unsafe to allow him to stand to void, and [place] stated [place]

## 2013-12-13 DIAGNOSIS — R55 Syncope and collapse: Secondary | ICD-10-CM

## 2013-12-13 NOTE — Progress Notes (Signed)
PT Cancellation Note  Patient Details Name: Stanley Greer MRN: [id number] DOB: 01/05/1971   Cancelled Treatment:    Reason Eval/Treat Not Completed: Patient at procedure or test/unavailable. Pt off the floor at CT scan. PT to return as able.   Marcene BrawnChadwell, Laraina Sulton Marie 12/13/2013, 10:11 AM  Lewis ShockAshly Quantavious Eggert, PT, DPT Pager #: [phone] Office #: [phone]

## 2013-12-13 NOTE — Progress Notes (Signed)
Stanley HaberMarland Kitchen16.1[place] [phone] EXTTAG>67yrBeverely [phone] HannifinNew HavenKentucky Lajoyce Corners77mGeorgetta HaberMarland Kitchen16.1[place] 28yrBeverely [phone] HannifinRieselKentucky Lajoyce Corners29mGeorgetta HaberMarland Kitchen16.1[place] 1yrBeverely [phone] HannifinMaloKentucky Lajoyce Corners51mGeorgetta HaberMarland Kitchen16.1[place] 43yrBeverely [phone] HannifinCarpenterKent[place] Lajoyce Corners46mGeorgetta HaberMarland Kitchen16.1[place] 17yrBeverely [phone] HannifinPark CityKent[place] Lajoyce Corners28mGeorgetta HaberMarland Kitchen16.1[place] 33yrBeverely [phone] HannifinCarlsbadKentucky Lajoyce Corners76mGeorgetta HaberMarland Kitchen16.1[place] 13yrBeverely [phone] HannifinMidlothianKentucky Lajoyce Corners49mGeorgetta HaberMarland Kitchen16.1[place] 77yrBeverely [phone] HannifinOntonagonKentucky  the skull through the vertex without intravenous contrast.  COMPARISON:  Earlier film, same date.  FINDINGS: Stable left-sided occipital skull fracture without displacement. The mastoid air cells and middle  ear cavities are clear. No extra-axial fluid collections. The ventricles are normal and stable. The gray-white differentiation is maintained. No new/acute intracranial findings. Stable hematoma in the sphenoid sinus likely due to small fractures.  IMPRESSION: Stable head CT findings.   Electronically Signed   By: Loralie Champagne M.D.   On: 12/12/2013 23:49   Ct Head Wo Contrast  12/12/2013   CLINICAL DATA:  Status post fall. Hit head on pavement, with loss of consciousness. Bilateral periorbital bruising, vomiting, headache and bilateral neck pain.  EXAM: CT HEAD WITHOUT CONTRAST  CT MAXILLOFACIAL WITHOUT CONTRAST  TECHNIQUE: Multidetector CT imaging of the head and maxillofacial structures were performed using the standard protocol without intravenous contrast. Multiplanar CT image reconstructions of the maxillofacial structures were also generated.  COMPARISON:  None.  FINDINGS: CT HEAD FINDINGS  There is no evidence of acute infarction, mass lesion, or intra- or extra-axial hemorrhage on CT.  The posterior fossa, including the cerebellum, brainstem and fourth ventricle, is within normal limits. The third and lateral ventricles, and basal ganglia are unremarkable in appearance. The cerebral hemispheres are symmetric in appearance, with normal gray-white differentiation. No mass effect or midline shift is seen.  There is a minimally displaced fracture through left occiput, extending to the left side of the base of the skull. On maxillofacial images, extension through the canal for the internal carotid artery is suspected. Blood is seen filling the sphenoid sinus.  The orbits are within normal limits. The remaining paranasal sinuses and mastoid air cells are well-aerated. No significant soft tissue abnormalities are seen.  CT MAXILLOFACIAL FINDINGS  There is a minimally displaced fracture through the left occiput, visualized as it extends across the left side of the base of the skull. This is suspected to extend  through the canal for the petrous portion of the left internal carotid artery. No additional fractures are seen.  The maxilla and mandible appear intact. The nasal bone is unremarkable in appearance. The visualized dentition demonstrates no acute abnormality.  The orbits are intact bilaterally. Blood is seen filling the sphenoid sinus. The remaining visualized paranasal sinuses and mastoid air cells are well-aerated.  Minimal air noted at the level of the foramen magnum, anterior to the spinal canal, may reflect trace air from a peripheral IV catheter. The parapharyngeal fat planes are preserved. The nasopharynx, oropharynx and hypopharynx are unremarkable in appearance. The visualized portions of the valleculae and piriform sinuses are grossly unremarkable.  The parotid and submandibular glands are within normal limits. No cervical lymphadenopathy is seen.  IMPRESSION: 1. No evidence of traumatic intracranial injury. 2. Minimally displaced fracture extending through the left occiput, down to the left side at the base of the skull. This is suspected to extend through the canal for the petrous portion of the left internal carotid artery. Blood seen filling the sphenoid sinus. CTA of the head is recommended for further evaluation, to exclude injury to the left internal carotid artery. 3. No evidence of fracture or dislocation with regard to the maxillofacial structures.  These results were called by telephone at the time of interpretation on 12/11/2013 at 11:57 pm to [facility], who verbally acknowledged these results.   Electronically Signed   By: Roanna Raider M.D.   On: 12/12/2013 00:04   Ct Maxillofacial Wo Cm  12/12/2013  CLINICAL DATA:  Status post fall. Hit head on pavement, with loss of consciousness. Bilateral periorbital bruising, vomiting, headache and bilateral neck pain.  EXAM: CT HEAD WITHOUT CONTRAST  CT MAXILLOFACIAL WITHOUT CONTRAST  TECHNIQUE: Multidetector CT imaging of the head and  maxillofacial structures were performed using the standard protocol without intravenous contrast. Multiplanar CT image reconstructions of the maxillofacial structures were also generated.  COMPARISON:  None.  FINDINGS: CT HEAD FINDINGS  There is no evidence of acute infarction, mass lesion, or intra- or extra-axial hemorrhage on CT.  The posterior fossa, including the cerebellum, brainstem and fourth ventricle, is within normal limits. The third and lateral ventricles, and basal ganglia are unremarkable in appearance. The cerebral hemispheres are symmetric in appearance, with normal gray-white differentiation. No mass effect or midline shift is seen.  There is a minimally displaced fracture through left occiput, extending to the left side of the base of the skull. On maxillofacial images, extension through the canal for the internal carotid artery is suspected. Blood is seen filling the sphenoid sinus.  The orbits are within normal limits. The remaining paranasal sinuses and mastoid air cells are well-aerated. No significant soft tissue abnormalities are seen.  CT MAXILLOFACIAL FINDINGS  There is a minimally displaced fracture through the left occiput, visualized as it extends across the left side of the base of the skull. This is suspected to extend through the canal for the petrous portion of the left internal carotid artery. No additional fractures are seen.  The maxilla and mandible appear intact. The nasal bone is unremarkable in appearance. The visualized dentition demonstrates no acute abnormality.  The orbits are intact bilaterally. Blood is seen filling the sphenoid sinus. The remaining visualized paranasal sinuses and mastoid air cells are well-aerated.  Minimal air noted at the level of the foramen magnum, anterior to the spinal canal, may reflect trace air from a peripheral IV catheter. The parapharyngeal fat planes are preserved. The nasopharynx, oropharynx and hypopharynx are unremarkable in  appearance. The visualized portions of the valleculae and piriform sinuses are grossly unremarkable.  The parotid and submandibular glands are within normal limits. No cervical lymphadenopathy is seen.  IMPRESSION: 1. No evidence of traumatic intracranial injury. 2. Minimally displaced fracture extending through the left occiput, down to the left side at the base of the skull. This is suspected to extend through the canal for the petrous portion of the left internal carotid artery. Blood seen filling the sphenoid sinus. CTA of the head is recommended for further evaluation, to exclude injury to the left internal carotid artery. 3. No evidence of fracture or dislocation with regard to the maxillofacial structures.  These results were called by telephone at the time of interpretation on 12/11/2013 at 11:57 pm to [facility], who verbally acknowledged these results.   Electronically Signed   By: Roanna RaiderJeffery  Chang M.D.   On: 12/12/2013 00:04    Assessment/Plan:    LOS: 2 days  CT stable per radilogist's review. Dr. Venetia MaxonStern will review & visit as well.    Stanley Cockeroteat, Stanley Greer 12/13/2013, 9:40 AM

## 2013-12-13 NOTE — Progress Notes (Signed)
*  PRELIMINARY RESULTS* Vascular Ultrasound Carotid Duplex (Doppler) has been completed.  Findings suggest 1-39% internal carotid artery stenosis bilaterally. Vertebral arteries are patent with antegrade flow, the left vertebral artery exhibits atypical flow.  12/13/2013 1:43 PM Gertie FeyMichelle Ambrie Carte, RVT, RDCS, RDMS

## 2013-12-13 NOTE — Progress Notes (Signed)
Physical Therapy Treatment Patient Details Name: Stanley Greer MRN: [id number] DOB: [birth year] Today's Date: 12/13/2013    History of Present Illness Admitted after syncopal episode with fall and basilar skiull fracture.    PT Comments    Pt functioning at supervision level. Pt remains to be light sensitive and slightly impulsive due to strong desire to return home today. PT freq decreased to monitor due to functioning at supervision level.  Follow Up Recommendations  No PT follow up;Supervision for mobility/OOB     Equipment Recommendations  None recommended by PT    Recommendations for Other Services       Precautions / Restrictions Precautions Precautions: Fall Restrictions Weight Bearing Restrictions: No    Mobility  Bed Mobility Overal bed mobility: Modified Independent                Transfers Overall transfer level: Needs assistance Equipment used: None Transfers: Sit to/from Stand Sit to Stand: Supervision         General transfer comment: supervision for safety due to mild impulsivity  Ambulation/Gait Ambulation/Gait assistance: Supervision Ambulation Distance (Feet): 500 Feet Assistive device: None Gait Pattern/deviations: Step-through pattern;WFL(Within Functional Limits)     General Gait Details: no episodes of LOB, pt still reports photophobia however did not limit ability to read signs or navigate in hallway   Stairs            Wheelchair Mobility    Modified Rankin (Stroke Patients Only)       Balance Overall balance assessment: No apparent balance deficits (not formally assessed) (pt able to stand and urinate and wash hands indep)                                  Cognition Arousal/Alertness: Awake/alert Behavior During Therapy:  (slightly agitated) Overall Cognitive Status: Within Functional Limits for tasks assessed                      Exercises      General Comments General comments (skin  integrity, edema, etc.): pt kept eyes closed t/o session      Pertinent Vitals/Pain Pain Assessment: No/denies pain    Home Living                      Prior Function            PT Goals (current goals can now be found in the care plan section) Progress towards PT goals: Progressing toward goals    Frequency  Min 1X/week    PT Plan Frequency needs to be updated    Co-evaluation             End of Session   Activity Tolerance: Patient tolerated treatment well Patient left: in chair;with call bell/phone within reach     Time: [id number] PT Time Calculation (min): 11 min  Charges:  $Gait Training: 8-22 mins                    G Codes:      Stanley Greer, Stanley Greer 12/13/2013, 12:32 PM  Stanley Greer, PT, DPT Pager #: [phone] Office #: [phone]

## 2013-12-13 NOTE — Progress Notes (Signed)
Patient ID: Stanley Greer, male   DOB: 04-15-1971, 43 y.o.   MRN: [id number] Alert and oriented. Followup CT scan looks good. Vital signs in raccoon's eyes are fading slightly. Discussed importance of avoiding heights avoiding driving and refraining from alcohol or any other recreational medications during recovery for the next 6 weeks time. Patient may be discharged from neurosurgical standpoint Followup with neurosurgery can be on a when necessary basis.

## 2013-12-13 NOTE — Progress Notes (Signed)
OT Cancellation Note  Patient Details Name: Stanley Greer MRN: [id number] DOB: December 13, 1970   Cancelled Treatment:    Reason Eval/Treat Not Completed: Patient at procedure or test/ unavailable. [facility]  Charod Slawinski , MS, OTR/L, CLT Pager: [phone] 12/13/2013, 11:21 AM

## 2013-12-13 NOTE — Discharge Instructions (Signed)
Basilar Skull Fracture A basilar skull fracture means there is a break or crack in one of the bones that make up the bottom (base) of the skull. Usually, the pieces of bone do not move out of place. The fracture is just a thin line that separates the bone. Basilar skull fractures often happen in the bones around the ears, nose, under the eyes, or near the upper spine. CAUSES  Most of the time, a basilar skull fracture is caused by a blow or forceful injury to the head. This could happen from:  A car crash.  Physical violence.  A fall from a high place. SYMPTOMS  Symptoms of a basilar skull fracture depend on where the fracture occurs. They also depend on what is near the fracture, such as blood vessels, nerves, and cerebrospinal fluid. Cerebrospinal fluid is the clear liquid that normally flows around the brain and spinal cord. Symptoms may include:  Clear liquid leaking or oozing from an ear or the nose.  Sudden loss of hearing after an injury.  Sudden loss of smell after an injury.  Blurred or double vision.  Trouble with balance or coordination.  Headache.  Nausea or vomiting.  Weakness or numbness in the face.  Bruises around the eyes.  Bruises behind the ear.  Blood leaking from the ear. DIAGNOSIS  Computed tomography (CT) is the best way to tell if you have a basilar skull fracture. Your caregiver may also:  Take a sample of the fluid leaking from your ear or nose. This fluid will then be tested in a lab.  Test your hearing.  Check the nerves in your face. TREATMENT  Most basilar skull fractures heal without treatment after several weeks. You may be given medicine for headaches or nausea. Sometimes, surgery is needed in complicated cases. HOME CARE INSTRUCTIONS  Only take over-the-counter or prescription medicines for pain, fever, or discomfort as directed by your caregiver.  Have someone stay with you when you go home. This person will need to observe you  closely for the next 48 hours.  Keep your head raised when you are lying down.  Rest. Avoid any activity that requires extra energy. Ask your caregiver when you can go back to your normal activities.  Do not drive until your caregiver says it is okay.  Do not drink alcohol until your caregiver says it is okay.  Keep all follow-up appointments as directed by your caregiver. SEEK MEDICAL CARE IF: Your symptoms do not go away as expected. SEEK IMMEDIATE MEDICAL CARE IF:  Your symptoms get worse.  You, your family, or your friends notice you are developing new symptoms.  Your family or friends notice you are very drowsy or you are not acting normally.  You have a fever. MAKE SURE YOU:  Understand these instructions.  Will watch your condition.  Will get help right away if you are not doing well or get worse. Document Released: 04/04/2011 Document Revised: 07/08/2011 Document Reviewed: 04/04/2011 [facility] Patient Information 2015 [place], [place]. This information is not intended to replace advice given to you by your health care provider. Make sure you discuss any questions you have with your health care provider. Syncope Syncope is a medical term for fainting or passing out. This means you lose consciousness and drop to the ground. People are generally unconscious for less than 5 minutes. You may have some muscle twitches for up to 15 seconds before waking up and returning to normal. Syncope occurs more often in older adults, but  it can happen to anyone. While most causes of syncope are not dangerous, syncope can be a sign of a serious medical problem. It is important to seek medical care.  CAUSES  Syncope is caused by a sudden drop in blood flow to the brain. The specific cause is often not determined. Factors that can bring on syncope include:  Taking medicines that lower blood pressure.  Sudden changes in posture, such as standing up quickly.  Taking more medicine than  prescribed.  Standing in one place for too long.  Seizure disorders.  Dehydration and excessive exposure to heat.  Low blood sugar (hypoglycemia).  Straining to have a bowel movement.  Heart disease, irregular heartbeat, or other circulatory problems.  Fear, emotional distress, seeing blood, or severe pain. SYMPTOMS  Right before fainting, you may:  Feel dizzy or light-headed.  Feel nauseous.  See all white or all black in your field of vision.  Have cold, clammy skin. DIAGNOSIS  Your health care provider will ask about your symptoms, perform a physical exam, and perform an electrocardiogram (ECG) to record the electrical activity of your heart. Your health care provider may also perform other heart or blood tests to determine the cause of your syncope which may include:  Transthoracic echocardiogram (TTE). During echocardiography, sound waves are used to evaluate how blood flows through your heart.  Transesophageal echocardiogram (TEE).  Cardiac monitoring. This allows your health care provider to monitor your heart rate and rhythm in real time.  Holter monitor. This is a portable device that records your heartbeat and can help diagnose heart arrhythmias. It allows your health care provider to track your heart activity for several days, if needed.  Stress tests by exercise or by giving medicine that makes the heart beat faster. TREATMENT  In most cases, no treatment is needed. Depending on the cause of your syncope, your health care provider may recommend changing or stopping some of your medicines. HOME CARE INSTRUCTIONS  Have someone stay with you until you feel stable.  Do not drive, use machinery, or play sports until your health care provider says it is okay.  Keep all follow-up appointments as directed by your health care provider.  Lie down right away if you start feeling like you might faint. Breathe deeply and steadily. Wait until all the symptoms have  passed.  Drink enough fluids to keep your urine clear or pale yellow.  If you are taking blood pressure or heart medicine, get up slowly and take several minutes to sit and then stand. This can reduce dizziness. SEEK IMMEDIATE MEDICAL CARE IF:   You have a severe headache.  You have unusual pain in the chest, abdomen, or back.  You are bleeding from your mouth or rectum, or you have black or tarry stool.  You have an irregular or very fast heartbeat.  You have pain with breathing.  You have repeated fainting or seizure-like jerking during an episode.  You faint when sitting or lying down.  You have confusion.  You have trouble walking.  You have severe weakness.  You have vision problems. If you fainted, call your local emergency services (911 in [place] Do not drive yourself to the hospital.  MAKE SURE YOU:  Understand these instructions.  Will watch your condition.  Will get help right away if you are not doing well or get worse. Document Released: 04/15/2005 Document Revised: 04/20/2013 Document Reviewed: 06/14/2011 [facility] Patient Information 2015 [place], [place]. This information is not intended to replace advice  given to you by your health care provider. Make sure you discuss any questions you have with your health care provider.

## 2013-12-13 NOTE — Progress Notes (Signed)
  Echocardiogram 2D Echocardiogram has been performed.  Kenry Daubert 12/13/2013, 11:00 AM

## 2013-12-13 NOTE — Progress Notes (Signed)
Dc papers given to pt, no obvious distress. No ted. aalert oriented. ambulatory with no difficulty. Family at bedside. Accepts dc ed and precautions. tranferred from floor per wc in no acute distress.

## 2013-12-13 NOTE — Discharge Summary (Signed)
Physician Discharge Summary  Stanley Greer RUE:[id number] DOB: 11/27/1970 DOA: 12/11/2013  PCP: Cassell SmilesFUSCO,LAWRENCE J., MD  Admit date: 12/11/2013 Discharge date: 12/13/2013  Time spent: 30 minutes  Recommendations for Outpatient Follow-up:  Patient will be discharged home. He will need followup with primary care physician within one week of discharge. Patient should also followup with neurosurgery, Dr. Venetia MaxonStern, within one month of discharge.  He should continue taking medications as prescribed. Patient should refrain from using the any recreational drugs. Patient should continue a regular diet. Patient to resume normal physical activity as tolerated.  Discharge Diagnoses:  Active Problems:   Basilar skull fracture   Syncopal episodes   polysubstance abuse  Discharge Condition: Stable  Diet recommendation: Regular  Filed Weights   12/11/13 2153 12/12/13 0503  Weight: 81.647 kg (180 lb) 79.5 kg (175 lb 4.3 oz)    History of present illness:  Stanley Greer is a 43 y.o. male who is otherwise healthy, who presented after an unwitnessed syncopal episode and fall. His sister was nearby. The patient had struck his head. EMS was called by his sister who evaluated him and brought him to the emergency department. The patient complained of head pain, that was improved with morphine. Additionally, patient had nausea. He admitted to some photosensitivity. He has no recollection of the event. He did admit to previous episodes of syncope, which had never been evaluated. These events have no warning. He denied palpitations, irregular heartbeat, presyncope.   Hospital Course:  Syncopal episode  -Unknown etiology  -Orthostatic vitals were negative -CT head: Minimally displaced fracture extending through the left occiput, down to the left side at the base of the skull -Repeat CT on 12/22/2013:  Stable head CT findings -Echocardiogram: EF 60-65%, normal study -Carotid Doppler: 1-39% internal carotid artery  stenosis bilaterally. Vertebral arteries are patent with antegrade flow, the left vertebral artery exhibits atypical flow. -Spoke with vascular who stated the waveform appears to be a "bunny sign" -Blood pressures in the upper extremities were close (120/72 and 127/74) -Toxicology screen: Positive for THC, opiates, amphetamine -Neurosurgery consulted and appreciated  Basilar skull fracture  -CT of the head: Minimally displaced fracture extending through the left occiput, down to the left side at the base of the skull -Neurosurgery consulted and appreciated  -Continue antiemetics and pain medication as needed   Procedures: Echocardiogram Study Conclusions  - Left ventricle: The cavity size was normal. Wall thickness was normal. Systolic function was normal. The estimated ejection fraction was in the range of 60% to 65%. Wall motion was normal; there were no regional wall motion abnormalities. Left ventricular diastolic function parameters were normal. Impressions: Normal study.  Carotid Doppler: 1-39% internal carotid artery stenosis bilaterally. Vertebral arteries are patent with antegrade flow, the left vertebral artery exhibits atypical flow.  Consultations: Neurosurgery  Discharge Exam: Filed Vitals:   12/13/13 1454  BP: 127/74  Pulse: 65  Temp:   Resp: 20     General: Well developed, well nourished, NAD, appears stated age  HEENT: [place], PERRLA, EOMI, Anicteic Sclera, mucous membranes moist. Patient does have significant ecchymosis noted on the eyelids/ raccoon eyes.  Neck: Supple, no JVD, no masses  Cardiovascular: S1 S2 auscultated, no rubs, murmurs or gallops. Regular rate and rhythm.  Respiratory: Clear to auscultation bilaterally with equal chest rise  Abdomen: Soft, nontender, nondistended, + bowel sounds  Extremities: warm dry without cyanosis clubbing or edema  Neuro: AAOx3, cranial nerves grossly intact. Strength 5/5 in patient's upper and lower extremities  bilaterally  Skin: Without rashes exudates or nodules, multiple tattoos  Psych: Normal affect and demeanor with intact judgement and insight  Discharge Instructions      Discharge Instructions   Discharge instructions    Complete by:  As directed   Patient will be discharged home. He will need followup with primary care physician within one week of discharge. Patient should also followup with neurosurgery, Dr. Venetia Maxon, within one month of discharge.  He should continue taking medications as prescribed. Patient should refrain from using the any recreational drugs. Patient should continue a regular diet. Patient to resume normal physical activity as tolerated.     Increase activity slowly    Complete by:  As directed             Medication List    STOP taking these medications       oxyCODONE-acetaminophen 7.5-325 MG per tablet  Commonly known as:  PERCOCET       No Known Allergies Follow-up Information   Follow up with Cassell Smiles., MD. Schedule an appointment as soon as possible for a visit in 1 week. [facility] followup)    Specialty:  Internal Medicine   Contact information:   [address] [place] [address] [phone]       Follow up with STERN,JOSEPH D, MD. Schedule an appointment as soon as possible for a visit in 1 month. [place] follow up, skull fracture)    Specialty:  Neurosurgery   Contact information:   [address] [place] [address] [phone]        The results of significant diagnostics from this hospitalization (including imaging, microbiology, ancillary and laboratory) are listed below for reference.    Significant Diagnostic Studies: Ct Angio Head W/cm &/or Wo Cm  12/12/2013   CLINICAL DATA:  Recent head trauma.  EXAM: CT ANGIOGRAPHY HEAD  TECHNIQUE: Multidetector CT imaging of the head was performed using the standard protocol during bolus administration of intravenous contrast. Multiplanar CT image  reconstructions and MIPs were obtained to evaluate the vascular anatomy.  CONTRAST:  80mL OMNIPAQUE IOHEXOL 350 MG/ML SOLN  COMPARISON:  CT of the head December 11, 2013 at 2345 hr.  FINDINGS: Anterior circulation: Normal appearance of the cervical internal carotid arteries, petrous, cavernous and supra clinoid internal carotid arteries. Widely patent anterior communicating artery. Normal appearance of the anterior and middle cerebral arteries. No early filling of the left cavernous sinus.  Posterior circulation: Left vertebral artery is dominant with normal appearance of the vertebral arteries, vertebrobasilar junction and basilar artery, as well as main branch vessels. Normal appearance of the posterior cerebral arteries.  No large vessel occlusion, hemodynamically significant stenosis, dissection, luminal irregularity, contrast extravasation or aneurysm within the anterior nor posterior circulation.  No abnormal parenchymal enhancement on delayed imaging. However, relative paucity of left transverse sinus enhancement. Left skullbase fracture better seen on prior bone windows. Sphenoid sinus opacification air-fluid level, similar.  Review of the MIP images confirms the above findings.  IMPRESSION: No acute vascular injury, no CT findings of carotid cavernous fistula.  Left skullbase fracture better seen on prior imaging. Sphenoid sinus opacification suggests underlying possible fracture.  Relative paucity of left transverse sinus enhancement, unclear if this reflects right-sided dominance though, given underlying fracture, dural venous sinus thrombosis not excluded.   Electronically Signed   By: Awilda Metro   On: 12/12/2013 00:57   Dg Cervical Spine Complete  12/11/2013   CLINICAL DATA:  Head and neck pain, status post  fall.  Lethargy.  EXAM: CERVICAL SPINE  4+ VIEWS  COMPARISON:  None.  FINDINGS: There is no evidence of fracture or subluxation. Vertebral bodies demonstrate normal height and alignment.  Intervertebral disc spaces are preserved. Prevertebral soft tissues are within normal limits. The provided odontoid view demonstrates no significant abnormality.  The visualized lung apices are clear.  IMPRESSION: No evidence of fracture or subluxation along the cervical spine.   Electronically Signed   By: Roanna Raider M.D.   On: 12/11/2013 23:42   Ct Head Wo Contrast  12/12/2013   CLINICAL DATA:  Followup skull fracture.  EXAM: CT HEAD WITHOUT CONTRAST  TECHNIQUE: Contiguous axial images were obtained from the base of the skull through the vertex without intravenous contrast.  COMPARISON:  Earlier film, same date.  FINDINGS: Stable left-sided occipital skull fracture without displacement. The mastoid air cells and middle ear cavities are clear. No extra-axial fluid collections. The ventricles are normal and stable. The gray-white differentiation is maintained. No new/acute intracranial findings. Stable hematoma in the sphenoid sinus likely due to small fractures.  IMPRESSION: Stable head CT findings.   Electronically Signed   By: Loralie Champagne M.D.   On: 12/12/2013 23:49   Ct Head Wo Contrast  12/12/2013   CLINICAL DATA:  Status post fall. Hit head on pavement, with loss of consciousness. Bilateral periorbital bruising, vomiting, headache and bilateral neck pain.  EXAM: CT HEAD WITHOUT CONTRAST  CT MAXILLOFACIAL WITHOUT CONTRAST  TECHNIQUE: Multidetector CT imaging of the head and maxillofacial structures were performed using the standard protocol without intravenous contrast. Multiplanar CT image reconstructions of the maxillofacial structures were also generated.  COMPARISON:  None.  FINDINGS: CT HEAD FINDINGS  There is no evidence of acute infarction, mass lesion, or intra- or extra-axial hemorrhage on CT.  The posterior fossa, including the cerebellum, brainstem and fourth ventricle, is within normal limits. The third and lateral ventricles, and basal ganglia are unremarkable in appearance. The  cerebral hemispheres are symmetric in appearance, with normal gray-white differentiation. No mass effect or midline shift is seen.  There is a minimally displaced fracture through left occiput, extending to the left side of the base of the skull. On maxillofacial images, extension through the canal for the internal carotid artery is suspected. Blood is seen filling the sphenoid sinus.  The orbits are within normal limits. The remaining paranasal sinuses and mastoid air cells are well-aerated. No significant soft tissue abnormalities are seen.  CT MAXILLOFACIAL FINDINGS  There is a minimally displaced fracture through the left occiput, visualized as it extends across the left side of the base of the skull. This is suspected to extend through the canal for the petrous portion of the left internal carotid artery. No additional fractures are seen.  The maxilla and mandible appear intact. The nasal bone is unremarkable in appearance. The visualized dentition demonstrates no acute abnormality.  The orbits are intact bilaterally. Blood is seen filling the sphenoid sinus. The remaining visualized paranasal sinuses and mastoid air cells are well-aerated.  Minimal air noted at the level of the foramen magnum, anterior to the spinal canal, may reflect trace air from a peripheral IV catheter. The parapharyngeal fat planes are preserved. The nasopharynx, oropharynx and hypopharynx are unremarkable in appearance. The visualized portions of the valleculae and piriform sinuses are grossly unremarkable.  The parotid and submandibular glands are within normal limits. No cervical lymphadenopathy is seen.  IMPRESSION: 1. No evidence of traumatic intracranial injury. 2. Minimally displaced fracture extending through the  left occiput, down to the left side at the base of the skull. This is suspected to extend through the canal for the petrous portion of the left internal carotid artery. Blood seen filling the sphenoid sinus. CTA of the  head is recommended for further evaluation, to exclude injury to the left internal carotid artery. 3. No evidence of fracture or dislocation with regard to the maxillofacial structures.  These results were called by telephone at the time of interpretation on 12/11/2013 at 11:57 pm to [facility], who verbally acknowledged these results.   Electronically Signed   By: Roanna Raider M.D.   On: 12/12/2013 00:04   Ct Maxillofacial Wo Cm  12/12/2013   CLINICAL DATA:  Status post fall. Hit head on pavement, with loss of consciousness. Bilateral periorbital bruising, vomiting, headache and bilateral neck pain.  EXAM: CT HEAD WITHOUT CONTRAST  CT MAXILLOFACIAL WITHOUT CONTRAST  TECHNIQUE: Multidetector CT imaging of the head and maxillofacial structures were performed using the standard protocol without intravenous contrast. Multiplanar CT image reconstructions of the maxillofacial structures were also generated.  COMPARISON:  None.  FINDINGS: CT HEAD FINDINGS  There is no evidence of acute infarction, mass lesion, or intra- or extra-axial hemorrhage on CT.  The posterior fossa, including the cerebellum, brainstem and fourth ventricle, is within normal limits. The third and lateral ventricles, and basal ganglia are unremarkable in appearance. The cerebral hemispheres are symmetric in appearance, with normal gray-white differentiation. No mass effect or midline shift is seen.  There is a minimally displaced fracture through left occiput, extending to the left side of the base of the skull. On maxillofacial images, extension through the canal for the internal carotid artery is suspected. Blood is seen filling the sphenoid sinus.  The orbits are within normal limits. The remaining paranasal sinuses and mastoid air cells are well-aerated. No significant soft tissue abnormalities are seen.  CT MAXILLOFACIAL FINDINGS  There is a minimally displaced fracture through the left occiput, visualized as it extends across the left  side of the base of the skull. This is suspected to extend through the canal for the petrous portion of the left internal carotid artery. No additional fractures are seen.  The maxilla and mandible appear intact. The nasal bone is unremarkable in appearance. The visualized dentition demonstrates no acute abnormality.  The orbits are intact bilaterally. Blood is seen filling the sphenoid sinus. The remaining visualized paranasal sinuses and mastoid air cells are well-aerated.  Minimal air noted at the level of the foramen magnum, anterior to the spinal canal, may reflect trace air from a peripheral IV catheter. The parapharyngeal fat planes are preserved. The nasopharynx, oropharynx and hypopharynx are unremarkable in appearance. The visualized portions of the valleculae and piriform sinuses are grossly unremarkable.  The parotid and submandibular glands are within normal limits. No cervical lymphadenopathy is seen.  IMPRESSION: 1. No evidence of traumatic intracranial injury. 2. Minimally displaced fracture extending through the left occiput, down to the left side at the base of the skull. This is suspected to extend through the canal for the petrous portion of the left internal carotid artery. Blood seen filling the sphenoid sinus. CTA of the head is recommended for further evaluation, to exclude injury to the left internal carotid artery. 3. No evidence of fracture or dislocation with regard to the maxillofacial structures.  These results were called by telephone at the time of interpretation on 12/11/2013 at 11:57 pm to [facility], who verbally acknowledged these results.   Electronically  Signed   By: Roanna Raider M.D.   On: 12/12/2013 00:04    Microbiology: No results found for this or any previous visit (from the past 240 hour(s)).   Labs: Basic Metabolic Panel:  Recent Labs Lab 12/11/13 2251 12/12/13 0800  NA 138 137  K 4.4 4.1  CL 101 102  CO2 25 22  GLUCOSE 145* 115*  BUN 11 9    CREATININE 0.77 0.68  CALCIUM 9.0 8.9   Liver Function Tests:  Recent Labs Lab 12/11/13 2251  AST 27  ALT 23  ALKPHOS 97  BILITOT 1.1  PROT 7.7  ALBUMIN 3.9   No results found for this basename: LIPASE, AMYLASE,  in the last 168 hours No results found for this basename: AMMONIA,  in the last 168 hours CBC:  Recent Labs Lab 12/11/13 2251 12/12/13 0800  WBC 11.4* 10.7*  NEUTROABS 10.1*  --   HGB 14.1 13.6  HCT 40.5 40.7  MCV 89.0 91.7  PLT 175 169   Cardiac Enzymes:  Recent Labs Lab 12/11/13 2251  TROPONINI <0.30   BNP: BNP (last 3 results) No results found for this basename: PROBNP,  in the last 8760 hours CBG: No results found for this basename: GLUCAP,  in the last 168 hours     Signed:  Edsel Petrin  [facility] 12/13/2013, 4:02 PM

## 2013-12-13 NOTE — Progress Notes (Signed)
Per discussion with primary service, patient doing well.  OK to go home and follow up with me in office.

## 2013-12-13 NOTE — Progress Notes (Signed)
OT Cancellation Note  Patient Details Name: Stanley Greer MRN: [id number] DOB: 03-24-1971   Cancelled Treatment:    Reason Eval/Treat Not Completed: OT screened, no needs identified, will sign off. Per discussion with PT(Ashly) no OT acute needs at this time.    Talon Regala , MS, OTR/L, CLT Pager: [phone] 12/13/2013, 1:06 PM

## 2015-09-13 IMAGING — CT CT HEAD W/O CM
1 series · 16 of 30 positions shown, 20 images · non-contrast
Comparison: Earlier film, same date.

CLINICAL DATA: Followup skull fracture.

EXAM:
CT HEAD WITHOUT CONTRAST
TECHNIQUE: Contiguous axial images were obtained from the base of the skull
through the vertex without intravenous contrast.

[Series 2: head 5.0 h30s · axial · 0.45mm/px · z∈[-135,+10]mm · 16 of 33 slices shown, 20 images]
[im 2/33  brain]
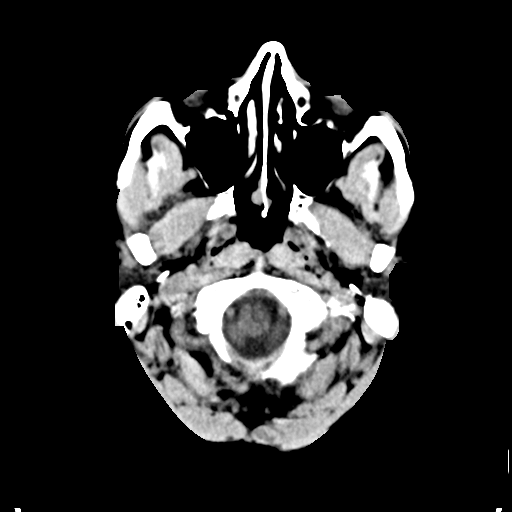
[im 2/33  bone]
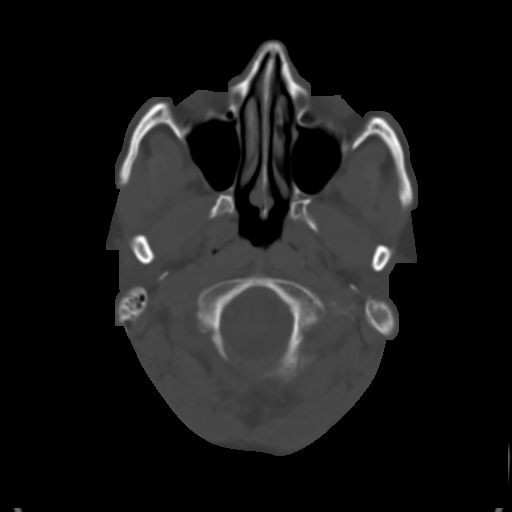
[im 4/33  brain]
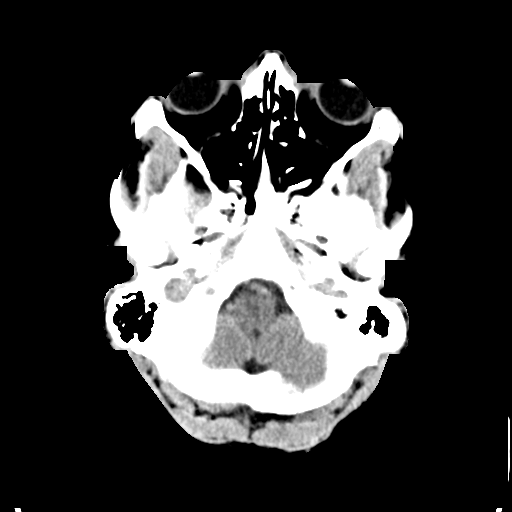
[im 6/33  brain]
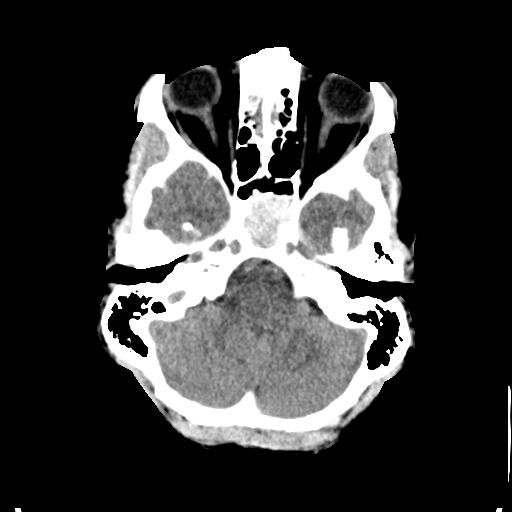
[im 8/33  brain]
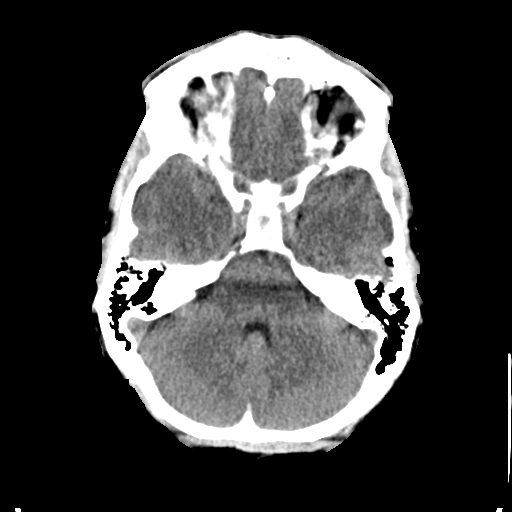
[im 9/33  brain]
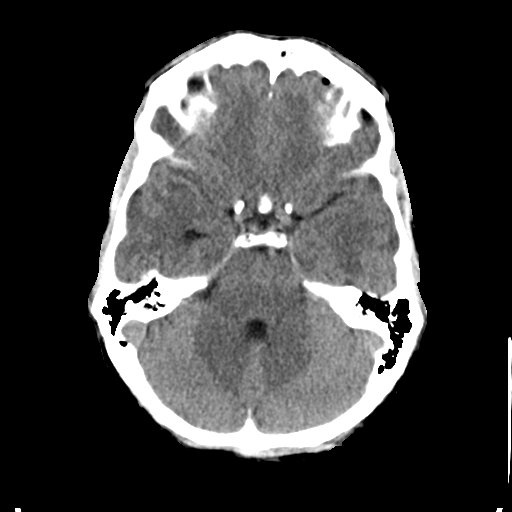
[im 9/33  bone]
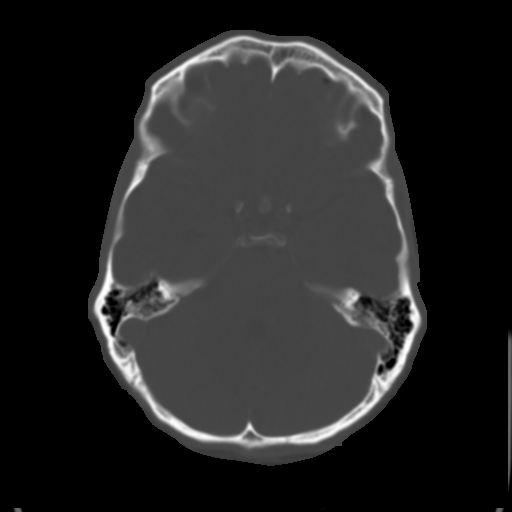
[im 12/33  brain]
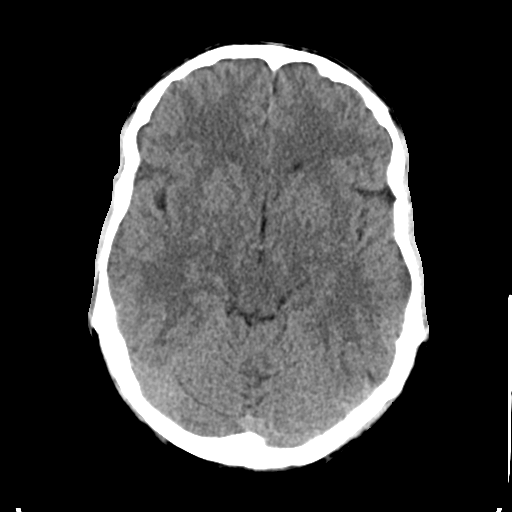
[im 14/33  brain]
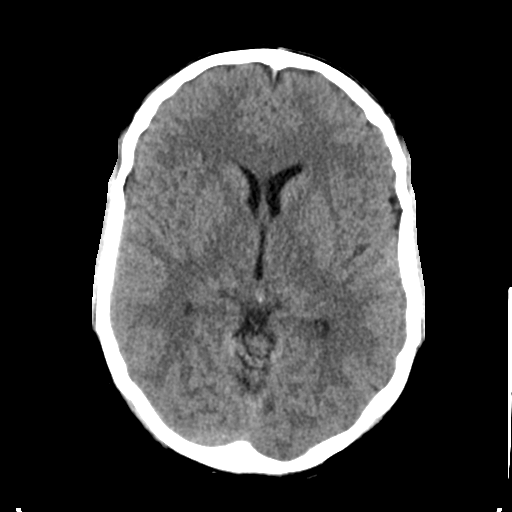
[im 16/33  brain]
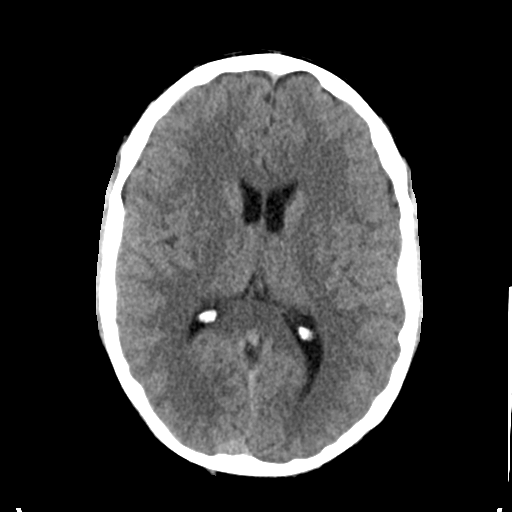
[im 17/33  brain]
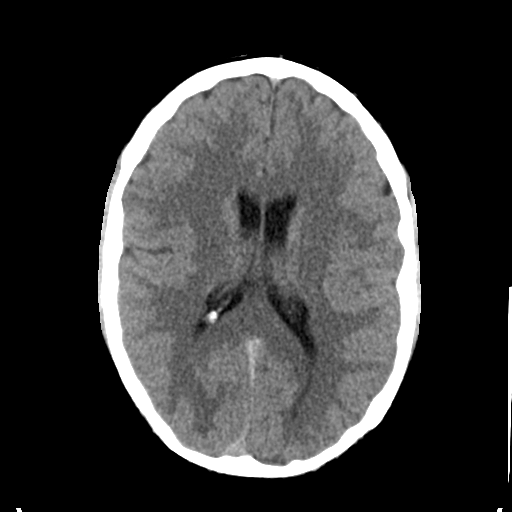
[im 17/33  bone]
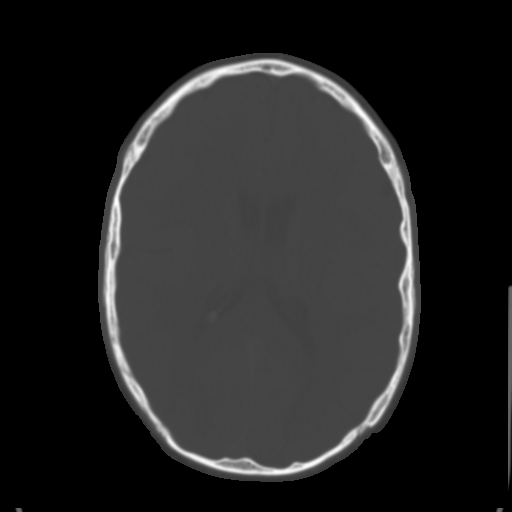
[im 19/33  brain]
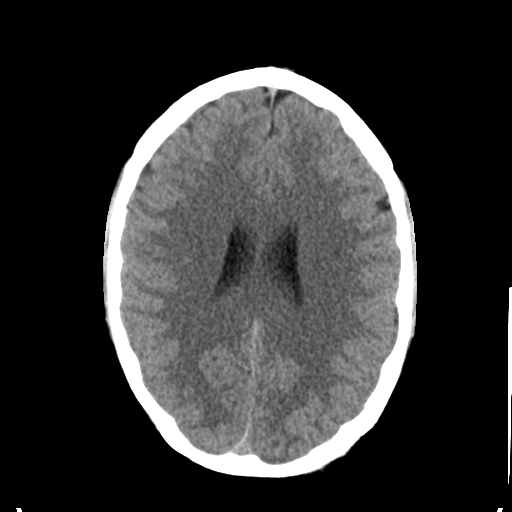
[im 21/33  brain]
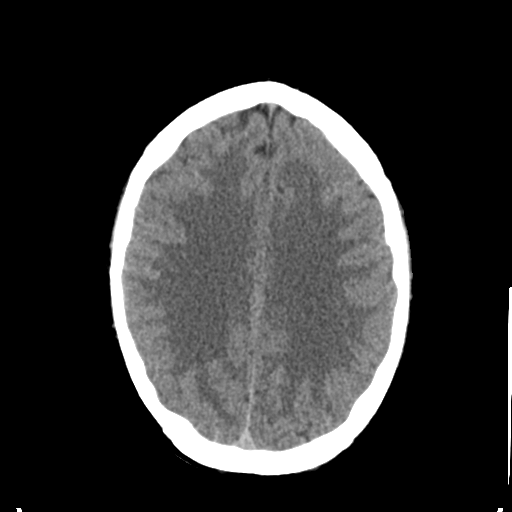
[im 24/33  brain]
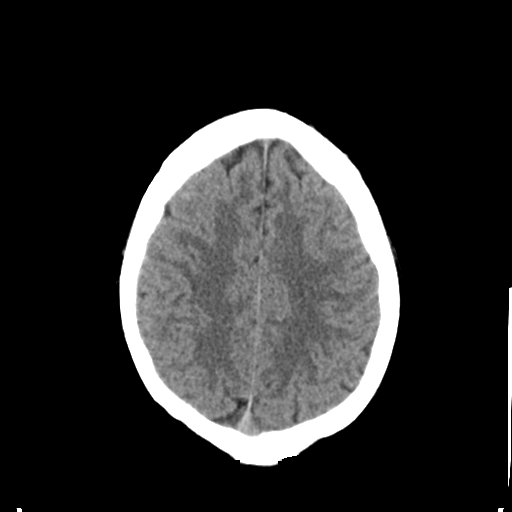
[im 25/33  brain]
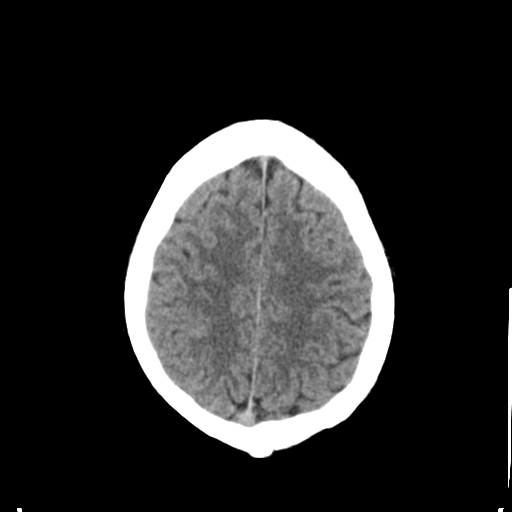
[im 25/33  bone]
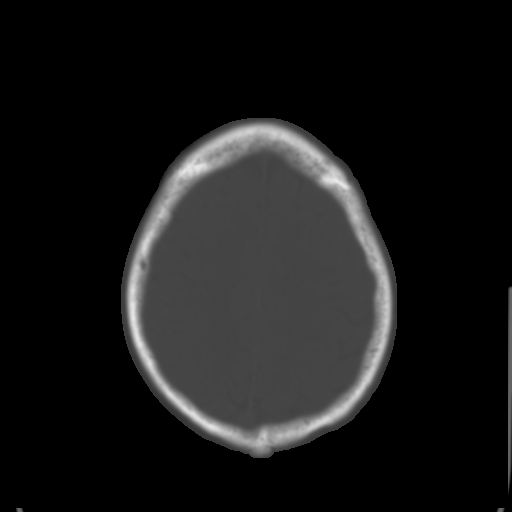
[im 27/33  brain]
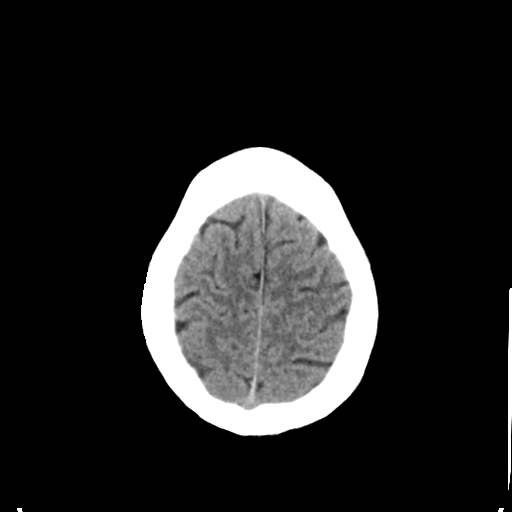
[im 29/33  brain]
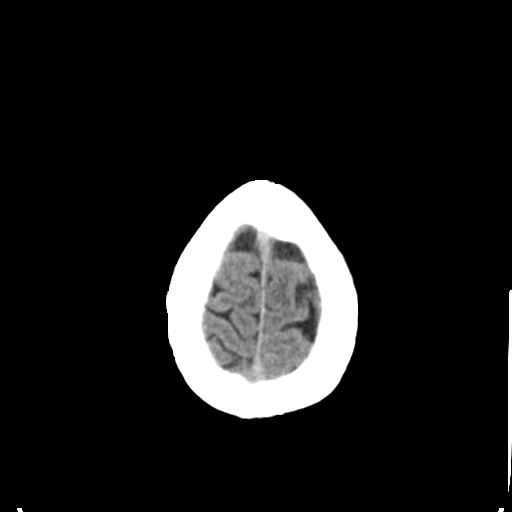
[im 31/33  brain]
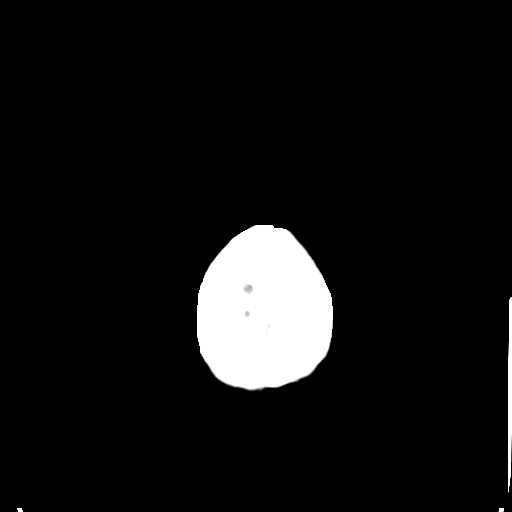

[16 of 30 positions shown; findings below may reference images not displayed]

FINDINGS: Stable left-sided occipital skull fracture without displacement. The
mastoid air cells and middle ear cavities are clear. No extra-axial
fluid collections. The ventricles are normal and stable. The
gray-white differentiation is maintained. No new/acute intracranial
findings. Stable hematoma in the sphenoid sinus likely due to small
fractures.
IMPRESSION: Stable head CT findings.

## 2016-01-26 DIAGNOSIS — D224 Melanocytic nevi of scalp and neck: Secondary | ICD-10-CM | POA: Diagnosis not present

## 2016-01-26 DIAGNOSIS — Z808 Family history of malignant neoplasm of other organs or systems: Secondary | ICD-10-CM | POA: Diagnosis not present

## 2016-01-26 DIAGNOSIS — Q833 Accessory nipple: Secondary | ICD-10-CM | POA: Diagnosis not present

## 2016-05-22 DIAGNOSIS — R05 Cough: Secondary | ICD-10-CM | POA: Diagnosis not present

## 2016-05-22 DIAGNOSIS — Z1389 Encounter for screening for other disorder: Secondary | ICD-10-CM | POA: Diagnosis not present

## 2016-05-22 DIAGNOSIS — J811 Chronic pulmonary edema: Secondary | ICD-10-CM | POA: Diagnosis not present

## 2016-05-22 DIAGNOSIS — Z6822 Body mass index (BMI) 22.0-22.9, adult: Secondary | ICD-10-CM | POA: Diagnosis not present

## 2016-05-28 DIAGNOSIS — Z6823 Body mass index (BMI) 23.0-23.9, adult: Secondary | ICD-10-CM | POA: Diagnosis not present

## 2016-05-28 DIAGNOSIS — Z0000 Encounter for general adult medical examination without abnormal findings: Secondary | ICD-10-CM | POA: Diagnosis not present

## 2016-05-28 DIAGNOSIS — Z1389 Encounter for screening for other disorder: Secondary | ICD-10-CM | POA: Diagnosis not present

## 2016-07-30 DIAGNOSIS — N529 Male erectile dysfunction, unspecified: Secondary | ICD-10-CM | POA: Diagnosis not present

## 2016-07-30 DIAGNOSIS — Z6823 Body mass index (BMI) 23.0-23.9, adult: Secondary | ICD-10-CM | POA: Diagnosis not present

## 2016-07-30 DIAGNOSIS — Z1389 Encounter for screening for other disorder: Secondary | ICD-10-CM | POA: Diagnosis not present

## 2016-11-12 DIAGNOSIS — F329 Major depressive disorder, single episode, unspecified: Secondary | ICD-10-CM | POA: Diagnosis not present

## 2016-11-14 DIAGNOSIS — F329 Major depressive disorder, single episode, unspecified: Secondary | ICD-10-CM | POA: Diagnosis not present

## 2016-11-18 DIAGNOSIS — F329 Major depressive disorder, single episode, unspecified: Secondary | ICD-10-CM | POA: Diagnosis not present

## 2017-02-11 ENCOUNTER — Encounter (HOSPITAL_COMMUNITY): Payer: Self-pay | Admitting: *Deleted

## 2017-02-11 ENCOUNTER — Emergency Department (HOSPITAL_COMMUNITY): Payer: BLUE CROSS/BLUE SHIELD

## 2017-02-11 ENCOUNTER — Emergency Department (HOSPITAL_COMMUNITY)
Admission: EM | Admit: 2017-02-11 | Discharge: 2017-02-12 | Disposition: A | Discharge status: Home / Self Care | Payer: BLUE CROSS/BLUE SHIELD | Attending: Emergency Medicine | Admitting: Emergency Medicine

## 2017-02-11 DIAGNOSIS — W208XXA Other cause of strike by thrown, projected or falling object, initial encounter: Secondary | ICD-10-CM | POA: Insufficient documentation

## 2017-02-11 DIAGNOSIS — Y929 Unspecified place or not applicable: Secondary | ICD-10-CM | POA: Diagnosis not present

## 2017-02-11 DIAGNOSIS — S301XXA Contusion of abdominal wall, initial encounter: Secondary | ICD-10-CM | POA: Insufficient documentation

## 2017-02-11 DIAGNOSIS — S3991XA Unspecified injury of abdomen, initial encounter: Secondary | ICD-10-CM | POA: Diagnosis not present

## 2017-02-11 DIAGNOSIS — Y999 Unspecified external cause status: Secondary | ICD-10-CM | POA: Insufficient documentation

## 2017-02-11 DIAGNOSIS — S3092XA Unspecified superficial injury of abdominal wall, initial encounter: Secondary | ICD-10-CM | POA: Diagnosis not present

## 2017-02-11 DIAGNOSIS — Y93H2 Activity, gardening and landscaping: Secondary | ICD-10-CM | POA: Insufficient documentation

## 2017-02-11 LAB — COMPREHENSIVE METABOLIC PANEL
ALBUMIN: 3.7 g/dL (ref 3.5–5.0)
ALK PHOS: 71 U/L (ref 38–126)
ALT: 21 U/L (ref 17–63)
AST: 26 U/L (ref 15–41)
Anion gap: 7 (ref 5–15)
BILIRUBIN TOTAL: 1.1 mg/dL (ref 0.3–1.2)
BUN: 14 mg/dL (ref 6–20)
CO2: 30 mmol/L (ref 22–32)
Calcium: 8.8 mg/dL — ABNORMAL LOW (ref 8.9–10.3)
Chloride: 100 mmol/L — ABNORMAL LOW (ref 101–111)
Creatinine, Ser: 0.71 mg/dL (ref 0.61–1.24)
GFR calc Af Amer: >60 mL/min (ref >60)
GFR calc non Af Amer: >60 mL/min (ref >60)
Glucose, Bld: 90 mg/dL (ref 65–99)
Potassium: 3.5 mmol/L (ref 3.5–5.1)
Sodium: 137 mmol/L (ref 135–145)
TOTAL PROTEIN: 7.1 g/dL (ref 6.5–8.1)

## 2017-02-11 LAB — CBC WITH DIFFERENTIAL/PLATELET
BASOS ABS: 0 10*3/uL (ref 0.0–0.1)
BASOS PCT: 0 %
Eosinophils Absolute: 0.1 10*3/uL (ref 0.0–0.7)
Eosinophils Relative: 2 %
HEMATOCRIT: 38.7 % — AB (ref 39.0–52.0)
HEMOGLOBIN: 13.1 g/dL (ref 13.0–17.0)
Lymphocytes Relative: 49 %
Lymphs Abs: 2.7 10*3/uL (ref 0.7–4.0)
MCH: 31.3 pg (ref 26.0–34.0)
MCHC: 33.9 g/dL (ref 30.0–36.0)
MCV: 92.6 fL (ref 78.0–100.0)
MONOS PCT: 10 %
Monocytes Absolute: 0.5 10*3/uL (ref 0.1–1.0)
NEUTROS PCT: 39 %
Neutro Abs: 2.1 10*3/uL (ref 1.7–7.7)
Platelets: 206 10*3/uL (ref 150–400)
RBC: 4.18 MIL/uL — ABNORMAL LOW (ref 4.22–5.81)
RDW: 13.1 % (ref 11.5–15.5)
WBC: 5.5 10*3/uL (ref 4.0–10.5)

## 2017-02-11 LAB — URINALYSIS, ROUTINE W REFLEX MICROSCOPIC
Bilirubin Urine: NEGATIVE
Glucose, UA: NEGATIVE mg/dL
HGB URINE DIPSTICK: NEGATIVE
Ketones, ur: NEGATIVE mg/dL
Leukocytes, UA: NEGATIVE
Nitrite: NEGATIVE
PROTEIN: NEGATIVE mg/dL
Specific Gravity, Urine: 1.02 (ref 1.005–1.030)
pH: 5 (ref 5.0–8.0)

## 2017-02-11 MED ORDER — IOPAMIDOL (ISOVUE-300) INJECTION 61%
100.0000 mL | Freq: Once | INTRAVENOUS | Status: AC | PRN
  Administered 2017-02-11: 100 mL via INTRAVENOUS

## 2017-02-11 MED ORDER — HYDROMORPHONE HCL 1 MG/ML IJ SOLN
1.0000 mg | Freq: Once | INTRAMUSCULAR | Status: AC
  Administered 2017-02-11: 1 mg via INTRAVENOUS
  Filled 2017-02-11: qty 1

## 2017-02-11 NOTE — ED Triage Notes (Addendum)
Pt was cutting down a tree today and was struck by it on his left side and then he fell to the ground. Denies LOC. Pt c/o pain to left side and left lower back. Pt has bruising to left side.

## 2017-02-12 MED ORDER — OXYCODONE-ACETAMINOPHEN 5-325 MG PO TABS: 1.0000 | ORAL_TABLET | Freq: Four times a day (QID) | ORAL | 0 refills | Status: AC | PRN

## 2017-02-12 MED ORDER — OXYCODONE-ACETAMINOPHEN 5-325 MG PO TABS
1.0000 | ORAL_TABLET | Freq: Once | ORAL | Status: AC
Start: 2017-02-12 — End: 2017-02-12
  Administered 2017-02-12: 1 via ORAL
  Filled 2017-02-12: qty 1

## 2017-02-12 NOTE — Discharge Instructions (Signed)
Your CT is very reassuring. There is no evidence of serious abdominal or pelvic injury  You will be very sore. The bruising will take weeks to improve.  Ice to keep swelling down. Take medications as prescribed. You can also take ibuprofen with this medication.   Return for worsening symptoms, including fever, escalating pain, or any other symptoms concerning to you

## 2017-02-12 NOTE — ED Provider Notes (Signed)
[facility] EMERGENCY DEPARTMENT Provider Note   CSN: [id number] Arrival date & time: 02/11/17  1803     History   Chief Complaint Chief Complaint  Patient presents with  . Back Pain    HPI Stanley Greer is a 46 y.o. male.  The history is provided by the patient.  Back Pain   This is a new problem. The current episode started more than 2 days ago. The problem occurs constantly. The problem has been gradually worsening. Associated with: a tree fell on him. Pain location: left flank. The quality of the pain is described as stabbing, shooting and aching. Radiates to: left abdomen. The pain is severe. The symptoms are aggravated by bending, twisting and certain positions. The pain is the same all the time. Associated symptoms include abdominal pain and abdominal swelling. Pertinent negatives include no chest pain, no fever, no numbness, no dysuria, no paresis, no tingling and no weakness. He has tried analgesics for the symptoms. The treatment provided no relief.   46 year old male who presents with left flank pain. He has no significant past medical history. Reports that he was cutting down a tree 4 days ago. As it was falling down and struck him on the left side and he fell to the ground. He did not hit his head or have loss of consciousness. He had significant pain over the left flank and left hemiabdomen. Over the next few days he has developed significant bruising. He's noticed that over the past day there is a lot more swelling and pain to the left side which brought him to the ED for evaluation. Denies hematuria, difficulty urinating, nausea or vomiting, diarrhea, chest pain or difficulty breathing, fevers or chills. He is not anticoagulated. He has tried over-the-counter analgesics without improvement in his symptoms.  History reviewed. No pertinent past medical history.  Patient Active Problem List   Diagnosis Date Noted  . Basilar skull fracture (HCC) 12/12/2013  . Syncopal  episodes 12/12/2013    Past Surgical History:  Procedure Laterality Date  . LAPAROSCOPIC APPENDECTOMY N/A 04/13/2013   Procedure: APPENDECTOMY LAPAROSCOPIC;  Surgeon: Dalia Heading, MD;  Location: [facility];  Service: General;  Laterality: N/A;  . VASECTOMY  2004       Home Medications    Prior to Admission medications   Medication Sig Start Date End Date Taking? Authorizing Provider  oxyCODONE-acetaminophen (PERCOCET/ROXICET) 5-325 MG tablet Take 1-2 tablets by mouth every 6 (six) hours as needed for moderate pain or severe pain. 02/12/17   Lavera Guise, MD    Family History No family history on file.  Social History Social History  Substance Use Topics  . Smoking status: Never Smoker  . Smokeless tobacco: Never Used  . Alcohol use Yes     Comment: couple of days a week      Allergies   Patient has no known allergies.   Review of Systems Review of Systems  Constitutional: Negative for fever.  Cardiovascular: Negative for chest pain.  Gastrointestinal: Positive for abdominal pain.  Genitourinary: Negative for dysuria.  Musculoskeletal: Positive for back pain.  Neurological: Negative for tingling, weakness and numbness.  All other systems reviewed and are negative.    Physical Exam Updated Vital Signs BP 98/82   Pulse 72   Temp 98.6 F (37 C) (Oral)   Resp 18   Ht 5\' 11"  (1.803 m)   Wt 79.4 kg (175 lb)   SpO2 99%   BMI 24.41 kg/m   Physical  Exam Physical Exam  Nursing note and vitals reviewed. Constitutional: Well developed, well nourished, non-toxic, and in no acute distress Head: Normocephalic and atraumatic.  Mouth/Throat: Oropharynx is clear and moist.  Neck: Normal range of motion. Neck supple.  Cardiovascular: Normal rate and regular rhythm.   Pulmonary/Chest: Effort normal and breath sounds normal.  Abdominal: Soft. There is left sided tenderness. There is no rebound and no guarding.  there is significant bruising and soft tissue swelling of  the left flank that extends to the left anterior abdomen. Musculoskeletal: No deformities.  Neurological: Alert, no facial droop, fluent speech, moves all extremities symmetrically Skin: Skin is warm and dry.  Psychiatric: Cooperative   ED Treatments / Results  Labs (all labs ordered are listed, but only abnormal results are displayed) Labs Reviewed  CBC WITH DIFFERENTIAL/PLATELET - Abnormal; Notable for the following:       Result Value   RBC 4.18 (*)    HCT 38.7 (*)    All other components within normal limits  COMPREHENSIVE METABOLIC PANEL - Abnormal; Notable for the following:    Chloride 100 (*)    Calcium 8.8 (*)    All other components within normal limits  URINALYSIS, ROUTINE W REFLEX MICROSCOPIC    EKG  EKG Interpretation None       Radiology Ct Abdomen Pelvis W Contrast  Result Date: 02/12/2017 CLINICAL DATA:  Blunt abdominal trauma. Pt was cutting down a tree today and was struck by it on his left side and then he fell to the ground. Denies LOC. Patient complains of pain to left side and left lower back. Bruising to left side. EXAM: CT ABDOMEN AND PELVIS WITH CONTRAST TECHNIQUE: Multidetector CT imaging of the abdomen and pelvis was performed using the standard protocol following bolus administration of intravenous contrast. CONTRAST:  100mL ISOVUE-300 IOPAMIDOL (ISOVUE-300) INJECTION 61% COMPARISON:  04/13/2013 FINDINGS: Lower chest:  Mild dependent changes in the lung bases. Hepatobiliary: No hepatic injury or perihepatic hematoma. Gallbladder is unremarkable Pancreas: Unremarkable. No pancreatic ductal dilatation or surrounding inflammatory changes. Spleen: No splenic injury or perisplenic hematoma. Adrenals/Urinary Tract: Right adrenal gland calcification, similar to previous study. This likely represents old hemorrhage or infection. No significant adrenal gland nodules. Kidneys are symmetrical. No evidence of renal hemorrhage or renal injury. No hydronephrosis or  hydroureter. Bladder is unremarkable. Stomach/Bowel: Stomach and small bowel are decompressed. Scattered stool throughout the colon. No colonic wall thickening or inflammation. Appendix is surgically absent. Vascular/Lymphatic: No significant vascular findings are present. No enlarged abdominal or pelvic lymph nodes. Reproductive: Prostate is unremarkable. Other: Large subcutaneous soft tissue fluid collection along the left flank and extending posteriorly to the posterior paraspinal region, extending from about the level of the lower ribs down to the pelvis. This likely represents subcutaneous hematoma. No free air or free fluid in the abdomen. Abdominal wall musculature appears intact. Musculoskeletal: Normal alignment of the lumbar spine. Cortical irregularity along the anterior superior endplate of L3 without change since previous study, likely representing a Schmorl's node. No acute vertebral compression deformities. The sacrum, pelvis, and hips appear intact. IMPRESSION: 1. Large subcutaneous soft tissue fluid collection along the left flank, likely representing soft tissue hematoma. 2. No evidence of solid organ injury or bowel perforation in the abdomen or pelvis. 3. Chronic right adrenal gland calcification likely representing sequela of old hemorrhage or infection. Electronically Signed   By: Burman NievesWilliam  Stevens M.D.   On: 02/12/2017 00:05    Procedures Procedures (including critical care time)  Medications Ordered  in ED Medications  HYDROmorphone (DILAUDID) injection 1 mg (1 mg Intravenous Given 02/11/17 2205)  iopamidol (ISOVUE-300) 61 % injection 100 mL (100 mLs Intravenous Contrast Given 02/11/17 2342)  oxyCODONE-acetaminophen (PERCOCET/ROXICET) 5-325 MG per tablet 1 tablet (1 tablet Oral Given 02/12/17 0031)     Initial Impression / Assessment and Plan / ED Course  I have reviewed the triage vital signs and the nursing notes.  Pertinent labs & imaging results that were available during  my care of the patient were reviewed by me and considered in my medical decision making (see chart for details).     Presents with significant swelling and bruising to the left flank and abdomen after being struck by a falling tree 4 days ago. He is nontoxic in no acute distress. Hemodynamically stable and afebrile. He does have significant bruising and soft tissue swelling over the left hemiabdomen and flank. There is tenderness to palpation. Given that he is 4 days out from his injury my suspicion for serious intra-abdominal or pelvic injury is lower. However given the significant swelling and bruising his CT was performed. This shows evidence of abdominal wall hematoma but no intra-abdominal or intrapelvic injuries.  He will be discharged with pain control. Discussed continued supportive care management. [place] CSRS reviewed. Only one prescription for hydrocodone cough medication this year. Low risk.   Strict return and follow-up instructions reviewed. He expressed understanding of all discharge instructions and felt comfortable with the plan of care.   Final Clinical Impressions(s) / ED Diagnoses   Final diagnoses:  Hematoma of left flank, initial encounter    New Prescriptions New Prescriptions   OXYCODONE-ACETAMINOPHEN (PERCOCET/ROXICET) 5-325 MG TABLET    Take 1-2 tablets by mouth every 6 (six) hours as needed for moderate pain or severe pain.     Lavera Guise, MD 02/12/17 [phone]

## 2017-02-20 DIAGNOSIS — Z1389 Encounter for screening for other disorder: Secondary | ICD-10-CM | POA: Diagnosis not present

## 2017-02-20 DIAGNOSIS — Z6825 Body mass index (BMI) 25.0-25.9, adult: Secondary | ICD-10-CM | POA: Diagnosis not present

## 2017-02-20 DIAGNOSIS — S3991XA Unspecified injury of abdomen, initial encounter: Secondary | ICD-10-CM | POA: Diagnosis not present

## 2017-02-20 DIAGNOSIS — T148XXS Other injury of unspecified body region, sequela: Secondary | ICD-10-CM | POA: Diagnosis not present

## 2017-02-20 DIAGNOSIS — E663 Overweight: Secondary | ICD-10-CM | POA: Diagnosis not present

## 2017-03-11 ENCOUNTER — Ambulatory Visit: Payer: BLUE CROSS/BLUE SHIELD | Admitting: General Surgery

## 2017-03-27 DIAGNOSIS — D225 Melanocytic nevi of trunk: Secondary | ICD-10-CM | POA: Diagnosis not present

## 2017-03-27 DIAGNOSIS — D224 Melanocytic nevi of scalp and neck: Secondary | ICD-10-CM | POA: Diagnosis not present

## 2017-03-27 DIAGNOSIS — D485 Neoplasm of uncertain behavior of skin: Secondary | ICD-10-CM | POA: Diagnosis not present

## 2017-03-27 DIAGNOSIS — D2272 Melanocytic nevi of left lower limb, including hip: Secondary | ICD-10-CM | POA: Diagnosis not present

## 2017-03-27 DIAGNOSIS — D2261 Melanocytic nevi of right upper limb, including shoulder: Secondary | ICD-10-CM | POA: Diagnosis not present

## 2017-03-27 DIAGNOSIS — Q833 Accessory nipple: Secondary | ICD-10-CM | POA: Diagnosis not present

## 2018-01-15 DIAGNOSIS — F4323 Adjustment disorder with mixed anxiety and depressed mood: Secondary | ICD-10-CM | POA: Diagnosis not present

## 2018-01-20 DIAGNOSIS — Z6822 Body mass index (BMI) 22.0-22.9, adult: Secondary | ICD-10-CM | POA: Diagnosis not present

## 2018-01-20 DIAGNOSIS — N529 Male erectile dysfunction, unspecified: Secondary | ICD-10-CM | POA: Diagnosis not present

## 2018-01-20 DIAGNOSIS — Z1389 Encounter for screening for other disorder: Secondary | ICD-10-CM | POA: Diagnosis not present

## 2018-04-16 DIAGNOSIS — D2272 Melanocytic nevi of left lower limb, including hip: Secondary | ICD-10-CM | POA: Diagnosis not present

## 2018-04-16 DIAGNOSIS — Q833 Accessory nipple: Secondary | ICD-10-CM | POA: Diagnosis not present

## 2018-04-16 DIAGNOSIS — L905 Scar conditions and fibrosis of skin: Secondary | ICD-10-CM | POA: Diagnosis not present

## 2018-04-16 DIAGNOSIS — D225 Melanocytic nevi of trunk: Secondary | ICD-10-CM | POA: Diagnosis not present

## 2018-04-16 DIAGNOSIS — D485 Neoplasm of uncertain behavior of skin: Secondary | ICD-10-CM | POA: Diagnosis not present

## 2018-04-16 DIAGNOSIS — D224 Melanocytic nevi of scalp and neck: Secondary | ICD-10-CM | POA: Diagnosis not present

## 2018-11-13 IMAGING — CT CT ABD-PELV W/ CM
2 of 5 series · 15 of 46 positions shown, 17 images · IV contrast (Isovue)
Comparison: 04/13/2013

CLINICAL DATA: Blunt abdominal trauma. Pt was cutting down a tree
today and was struck by it on his left side and then he fell to the
ground. Denies LOC. Patient complains of pain to left side and left
lower back. Bruising to left side.

EXAM:
CT ABDOMEN AND PELVIS WITH CONTRAST
TECHNIQUE: Multidetector CT imaging of the abdomen and pelvis was performed
using the standard protocol following bolus administration of
intravenous contrast.
CONTRAST:  100mL REWFJD-M66 IOPAMIDOL (REWFJD-M66) INJECTION 61%

[Series 2: axial st · axial · 0.79mm/px · z∈[-455,-65]mm · 12 of 94 slices shown, 14 images]
[im 8/94  soft-tissue]
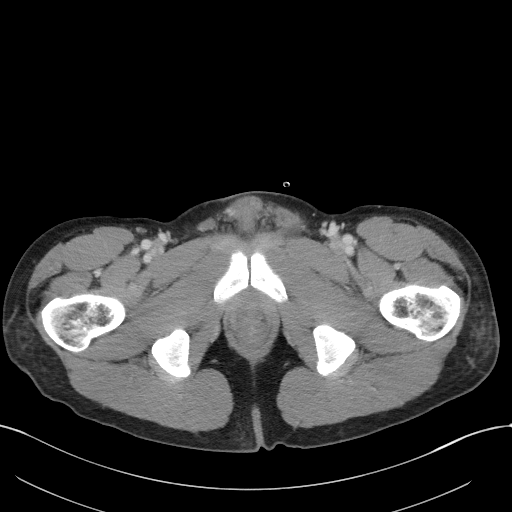
[im 8/94  bone]
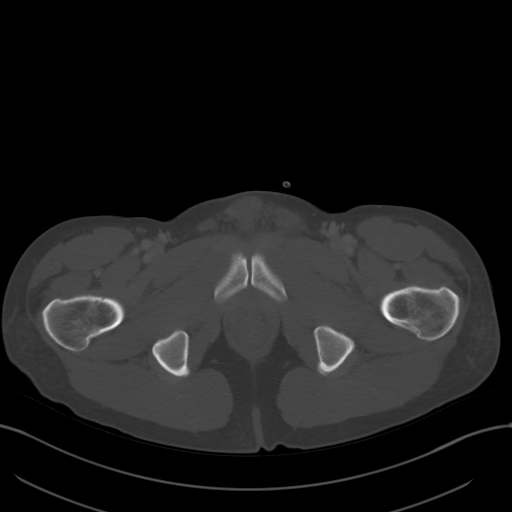
[im 15/94  soft-tissue]
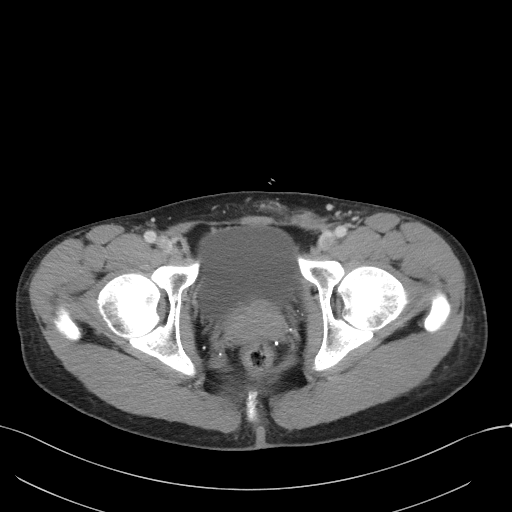
[im 22/94  soft-tissue]
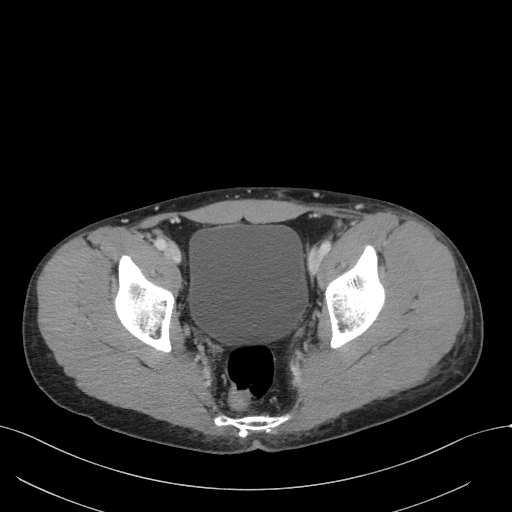
[im 29/94  soft-tissue]
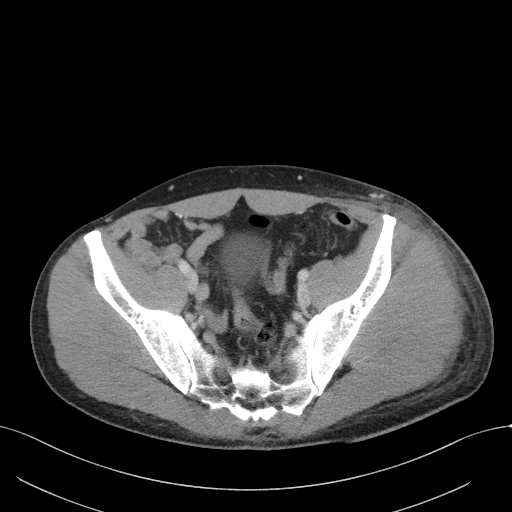
[im 36/94  soft-tissue]
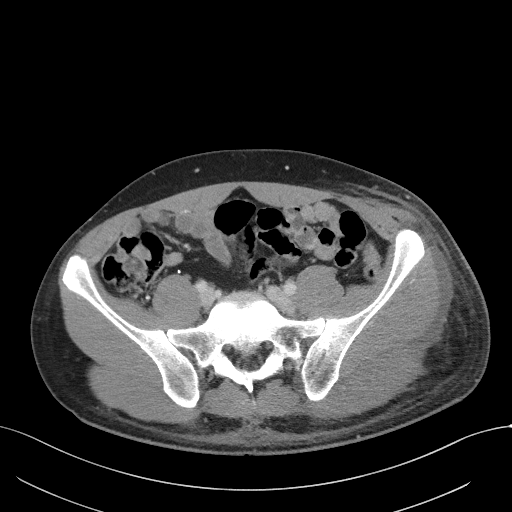
[im 43/94  soft-tissue]
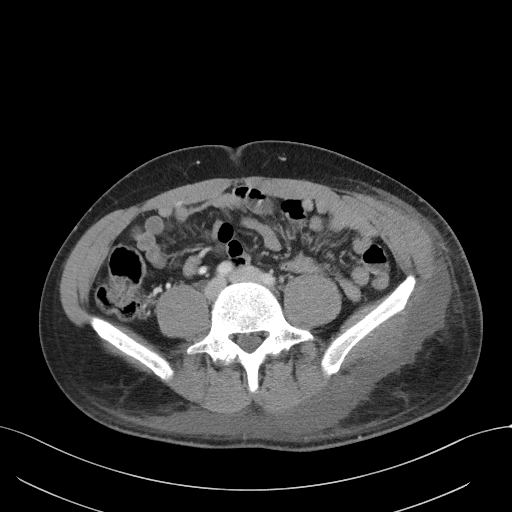
[im 51/94  soft-tissue]
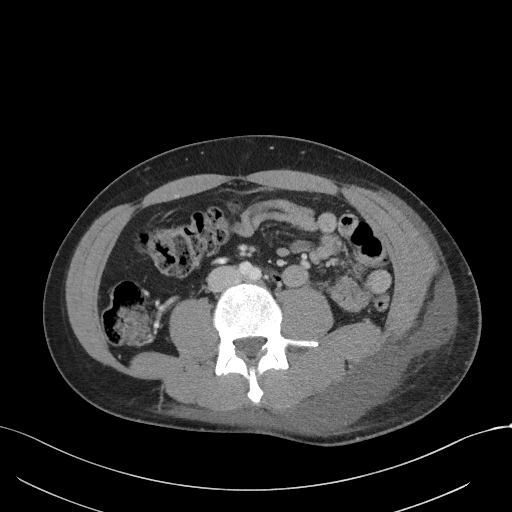
[im 58/94  soft-tissue]
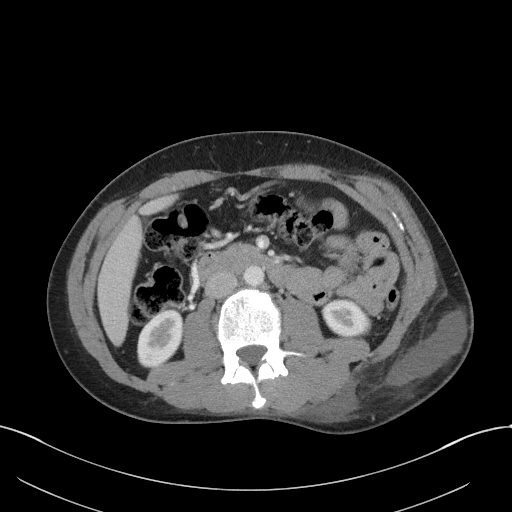
[im 65/94  soft-tissue]
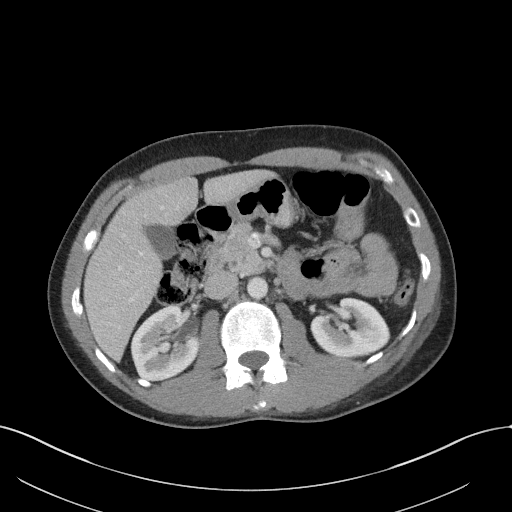
[im 65/94  bone]
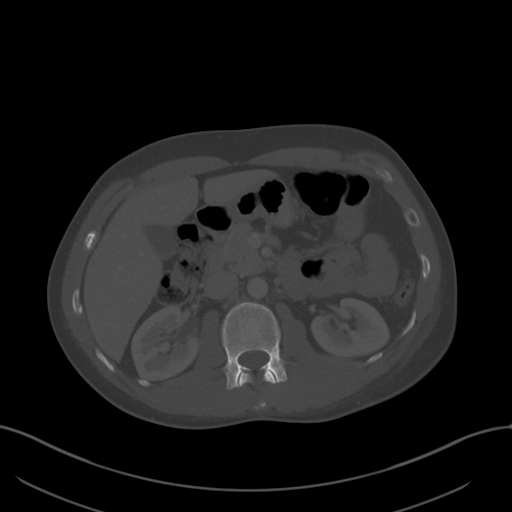
[im 72/94  soft-tissue]
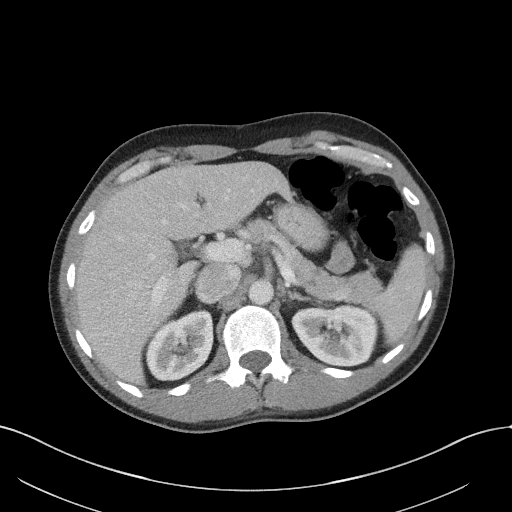
[im 79/94  soft-tissue]
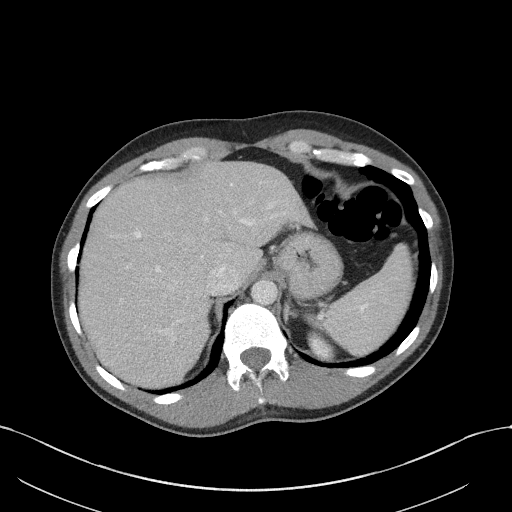
[im 86/94  soft-tissue]
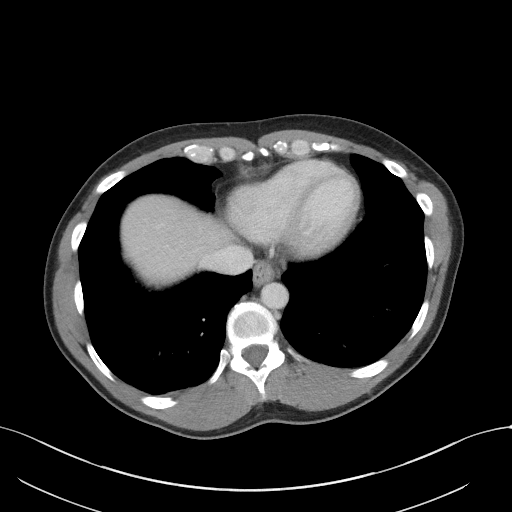

[Series 5: coronal st · coronal · 0.81mm/px · 3 of 85 slices shown]
[im 29/85  soft-tissue]
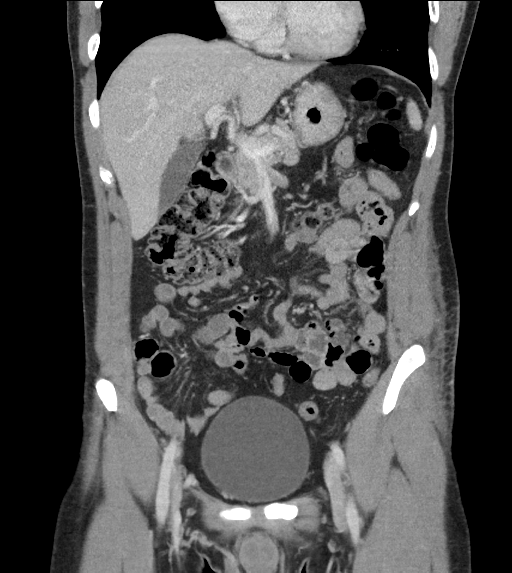
[im 38/85  soft-tissue]
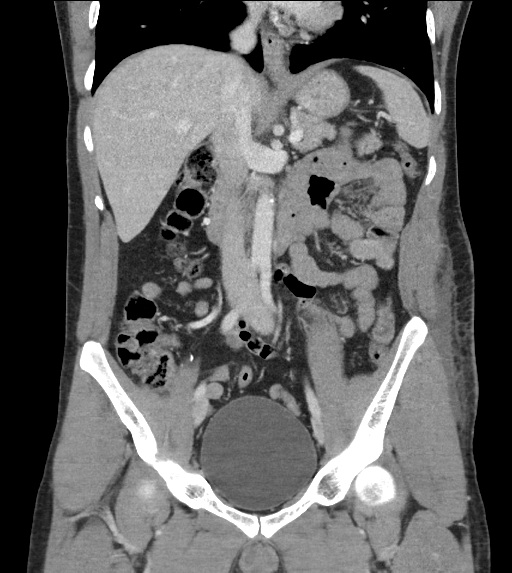
[im 47/85  soft-tissue]
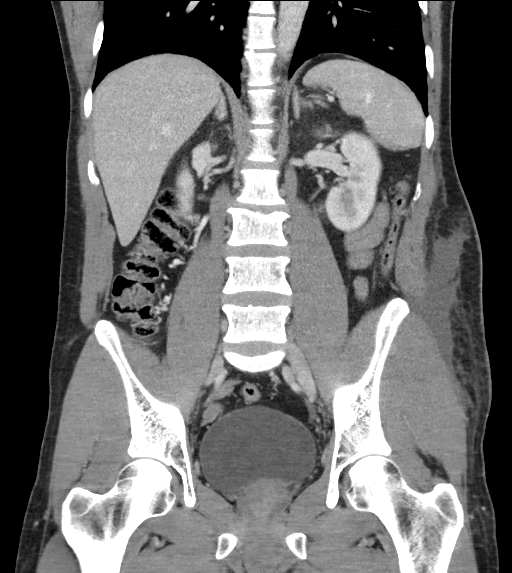

[15 of 46 positions shown; findings below may reference images not displayed]

FINDINGS: Lower chest:  Mild dependent changes in the lung bases.

Hepatobiliary: No hepatic injury or perihepatic hematoma.
Gallbladder is unremarkable

Pancreas: Unremarkable. No pancreatic ductal dilatation or
surrounding inflammatory changes.

Spleen: No splenic injury or perisplenic hematoma.

Adrenals/Urinary Tract: Right adrenal gland calcification, similar
to previous study. This likely represents old hemorrhage or
infection. No significant adrenal gland nodules. Kidneys are
symmetrical. No evidence of renal hemorrhage or renal injury. No
hydronephrosis or hydroureter. Bladder is unremarkable.

Stomach/Bowel: Stomach and small bowel are decompressed. Scattered
stool throughout the colon. No colonic wall thickening or
inflammation. Appendix is surgically absent.

Vascular/Lymphatic: No significant vascular findings are present. No
enlarged abdominal or pelvic lymph nodes.

Reproductive: Prostate is unremarkable.

Other: Large subcutaneous soft tissue fluid collection along the
left flank and extending posteriorly to the posterior paraspinal
region, extending from about the level of the lower ribs down to the
pelvis. This likely represents subcutaneous hematoma. No free air or
free fluid in the abdomen. Abdominal wall musculature appears
intact.

Musculoskeletal: Normal alignment of the lumbar spine. Cortical
irregularity along the anterior superior endplate of L3 without
change since previous study, likely representing a Schmorl's node.
No acute vertebral compression deformities. The sacrum, pelvis, and
hips appear intact.
IMPRESSION: 1. Large subcutaneous soft tissue fluid collection along the left
flank, likely representing soft tissue hematoma.
2. No evidence of solid organ injury or bowel perforation in the
abdomen or pelvis.
3. Chronic right adrenal gland calcification likely representing
sequela of old hemorrhage or infection.

## 2018-12-08 DIAGNOSIS — H9313 Tinnitus, bilateral: Secondary | ICD-10-CM | POA: Diagnosis not present

## 2018-12-08 DIAGNOSIS — H903 Sensorineural hearing loss, bilateral: Secondary | ICD-10-CM | POA: Diagnosis not present

## 2021-03-24 DIAGNOSIS — M795 Residual foreign body in soft tissue: Secondary | ICD-10-CM | POA: Diagnosis not present

## 2021-03-24 DIAGNOSIS — M79672 Pain in left foot: Secondary | ICD-10-CM | POA: Diagnosis not present

## 2021-03-24 DIAGNOSIS — Z23 Encounter for immunization: Secondary | ICD-10-CM | POA: Diagnosis not present

## 2022-08-16 DIAGNOSIS — Z0000 Encounter for general adult medical examination without abnormal findings: Secondary | ICD-10-CM | POA: Diagnosis not present

## 2022-08-16 DIAGNOSIS — Z1331 Encounter for screening for depression: Secondary | ICD-10-CM | POA: Diagnosis not present

## 2022-08-16 DIAGNOSIS — Z6824 Body mass index (BMI) 24.0-24.9, adult: Secondary | ICD-10-CM | POA: Diagnosis not present

## 2022-08-22 ENCOUNTER — Encounter: Payer: Self-pay | Admitting: *Deleted

## 2022-10-04 DIAGNOSIS — Q833 Accessory nipple: Secondary | ICD-10-CM | POA: Diagnosis not present

## 2022-10-04 DIAGNOSIS — L570 Actinic keratosis: Secondary | ICD-10-CM | POA: Diagnosis not present

## 2022-10-04 DIAGNOSIS — D2272 Melanocytic nevi of left lower limb, including hip: Secondary | ICD-10-CM | POA: Diagnosis not present

## 2022-10-04 DIAGNOSIS — L578 Other skin changes due to chronic exposure to nonionizing radiation: Secondary | ICD-10-CM | POA: Diagnosis not present

## 2022-10-04 DIAGNOSIS — D485 Neoplasm of uncertain behavior of skin: Secondary | ICD-10-CM | POA: Diagnosis not present

## 2022-10-04 DIAGNOSIS — D225 Melanocytic nevi of trunk: Secondary | ICD-10-CM | POA: Diagnosis not present

## 2022-10-11 DIAGNOSIS — K635 Polyp of colon: Secondary | ICD-10-CM | POA: Diagnosis not present

## 2022-10-11 DIAGNOSIS — K648 Other hemorrhoids: Secondary | ICD-10-CM | POA: Diagnosis not present

## 2022-10-11 DIAGNOSIS — Z1211 Encounter for screening for malignant neoplasm of colon: Secondary | ICD-10-CM | POA: Diagnosis not present

## 2022-10-11 DIAGNOSIS — K5730 Diverticulosis of large intestine without perforation or abscess without bleeding: Secondary | ICD-10-CM | POA: Diagnosis not present

## 2023-01-31 ENCOUNTER — Encounter: Payer: Self-pay | Admitting: *Deleted

## 2023-11-05 DIAGNOSIS — L820 Inflamed seborrheic keratosis: Secondary | ICD-10-CM | POA: Diagnosis not present

## 2023-11-05 DIAGNOSIS — Z86018 Personal history of other benign neoplasm: Secondary | ICD-10-CM | POA: Diagnosis not present

## 2023-11-05 DIAGNOSIS — D485 Neoplasm of uncertain behavior of skin: Secondary | ICD-10-CM | POA: Diagnosis not present

## 2023-11-05 DIAGNOSIS — L578 Other skin changes due to chronic exposure to nonionizing radiation: Secondary | ICD-10-CM | POA: Diagnosis not present

## 2023-11-05 DIAGNOSIS — D225 Melanocytic nevi of trunk: Secondary | ICD-10-CM | POA: Diagnosis not present
